# Patient Record
Sex: Female | Born: 1995 | Race: Black or African American | Hispanic: No | Marital: Single | Smoking: Never smoker
Health system: Southern US, Community
[De-identification: ages and names within clinical notes are randomized; demographics above are authoritative.]

---
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
## 2019-05-23 ENCOUNTER — Other Ambulatory Visit: Payer: Self-pay

## 2019-05-23 ENCOUNTER — Emergency Department (HOSPITAL_BASED_OUTPATIENT_CLINIC_OR_DEPARTMENT_OTHER): Payer: Medicaid Other

## 2019-05-23 ENCOUNTER — Encounter (HOSPITAL_BASED_OUTPATIENT_CLINIC_OR_DEPARTMENT_OTHER): Payer: Self-pay | Admitting: Emergency Medicine

## 2019-05-23 ENCOUNTER — Emergency Department (HOSPITAL_BASED_OUTPATIENT_CLINIC_OR_DEPARTMENT_OTHER)
Admission: EM | Admit: 2019-05-23 | Discharge: 2019-05-23 | Disposition: A | Discharge status: Home / Self Care | Payer: Medicaid Other | Attending: Emergency Medicine | Admitting: Emergency Medicine

## 2019-05-23 DIAGNOSIS — M940 Chondrocostal junction syndrome [Tietze]: Secondary | ICD-10-CM | POA: Insufficient documentation

## 2019-05-23 DIAGNOSIS — R0602 Shortness of breath: Secondary | ICD-10-CM | POA: Insufficient documentation

## 2019-05-23 MED ORDER — IBUPROFEN 600 MG PO TABS: 600.0000 mg | ORAL_TABLET | Freq: Four times a day (QID) | ORAL | 0 refills | Status: DC | PRN

## 2019-05-23 MED ORDER — IBUPROFEN 100 MG/5ML PO SUSP
600.0000 mg | Freq: Once | ORAL | Status: AC
  Administered 2019-05-23: 600 mg via ORAL
  Filled 2019-05-23: qty 30

## 2019-05-23 MED ORDER — IBUPROFEN 400 MG PO TABS
600.0000 mg | ORAL_TABLET | Freq: Once | ORAL | Status: DC
  Filled 2019-05-23: qty 1

## 2019-05-23 NOTE — ED Provider Notes (Signed)
[facility] EMERGENCY DEPARTMENT Provider Note   CSN: [id number] Arrival date & time: 05/23/19  1738     History Chief Complaint  Patient presents with  . Chest Pain    Whitney Hatfield is a 24 y.o. female presented to emergency department with sharp, pleuritic right-sided chest pain.  She reports that this began earlier this morning upon waking up.  She describes a painful sensation along her sternal rib line bilaterally, that radiates along the bottom of her ribs bilaterally, also radiates up towards her neck.  It is worse with coughing.  It is very sharp pain.  She has never felt this before.  She denies any chest pressure.  She denies any nausea vomiting or diarrhea.  She denies any fevers or chills.  She denies any sore throat, headaches, viral URI type symptoms recently.  She has not taken any medications for this at home.  No hemoptysis or asymmetric LE edema. Patient denies personal or family history of DVT or PE. No recent hormone use (including OCP); travel for >6 hours; prolonged immobilization for greater than 3 days; surgeries or trauma in the last 4 weeks; or malignancy with treatment within 6 months.  No sig family hx of MI.  She smokes marijuana occasionally, not daily, no cigarette smoking.  HPI     History reviewed. No pertinent past medical history.  There are no problems to display for this patient.   History reviewed. No pertinent surgical history.   OB History   No obstetric history on file.     No family history on file.  Social History   Tobacco Use  . Smoking status: Never Smoker  . Smokeless tobacco: Never Used  Substance Use Topics  . Alcohol use: Not Currently  . Drug use: Not on file    Home Medications Prior to Admission medications   Medication Sig Start Date End Date Taking? Authorizing Provider  ibuprofen (ADVIL) 600 MG tablet Take 1 tablet (600 mg total) by mouth every 6 (six) hours as needed for up to 30 doses. 05/23/19    Terald Sleeper, MD    Allergies    Patient has no known allergies.  Review of Systems   Review of Systems  Constitutional: Negative for chills and fever.  HENT: Negative for ear pain and sore throat.   Eyes: Negative for pain and visual disturbance.  Respiratory: Positive for shortness of breath. Negative for cough.   Cardiovascular: Positive for chest pain. Negative for palpitations.  Gastrointestinal: Negative for abdominal pain and vomiting.  Musculoskeletal: Positive for arthralgias and myalgias. Negative for back pain and neck pain.  Skin: Negative for color change and rash.  Neurological: Negative for syncope and headaches.  Psychiatric/Behavioral: Negative for agitation and confusion.  All other systems reviewed and are negative.   Physical Exam Updated Vital Signs BP 121/69 (BP Location: Right Arm)   Pulse 75   Temp 98.2 F (36.8 C)   Resp 15   Ht 5\' 4"  (1.626 m)   Wt 69.9 kg   SpO2 100%   BMI 26.43 kg/m   Physical Exam Vitals and nursing note reviewed.  Constitutional:      General: She is not in acute distress.    Appearance: She is well-developed.  HENT:     Head: Normocephalic and atraumatic.  Eyes:     Conjunctiva/sclera: Conjunctivae normal.  Cardiovascular:     Rate and Rhythm: Normal rate and regular rhythm.     Heart sounds: Normal heart sounds.  No murmur.  Pulmonary:     Effort: Pulmonary effort is normal. No respiratory distress.     Breath sounds: Normal breath sounds.  Abdominal:     Palpations: Abdomen is soft.     Tenderness: There is no abdominal tenderness.  Musculoskeletal:     Cervical back: Neck supple.     Comments: Parasternal costochondral muscle tenderness, with reproducible chest pain to palpation Positive crowing rooster sign  Skin:    General: Skin is warm and dry.  Neurological:     General: No focal deficit present.     Mental Status: She is alert and oriented to person, place, and time.  Psychiatric:        Mood  and Affect: Mood normal.        Behavior: Behavior normal.     ED Results / Procedures / Treatments   Labs (all labs ordered are listed, but only abnormal results are displayed) Labs Reviewed - No data to display  EKG EKG Interpretation  Date/Time:  Wednesday May 23 2019 17:45:24 EDT Ventricular Rate:  73 PR Interval:    QRS Duration: 86 QT Interval:  359 QTC Calculation: 396 R Axis:   46 Text Interpretation: Sinus rhythm Borderline short PR interval Borderline low voltage, extremity leads No STEMI Confirmed by Octaviano Glow [id number]) on 05/23/2019 6:30:52 PM   Radiology DG Chest 2 View  Result Date: 05/23/2019 CLINICAL DATA:  Burning RIGHT-side chest pain radiating to neck and abdomen for 1 day, increased pain with inspiration EXAM: CHEST - 2 VIEW COMPARISON:  11/24/2018 FINDINGS: Jewelry artifacts project over lower chest. Normal heart size, mediastinal contours, and pulmonary vascularity. Lungs clear. No pulmonary infiltrate, pleural effusion, or pneumothorax. Bones unremarkable. IMPRESSION: No acute abnormalities. Electronically Signed   By: Lavonia Dana M.D.   On: 05/23/2019 19:10    Procedures Procedures (including critical care time)  Medications Ordered in ED Medications  ibuprofen (ADVIL) 100 MG/5ML suspension 600 mg (600 mg Oral Given 05/23/19 1825)    ED Course  I have reviewed the triage vital signs and the nursing notes.  Pertinent labs & imaging results that were available during my care of the patient were reviewed by me and considered in my medical decision making (see chart for details).  23 year old female here with anterior sharp chest pain since this morning.  Extremely reproducible on exam.  Her tenderness is along the parasternal midline at the insertion point of her costochondral muscles, highly suggestive of costochrondritis.    ECG per my interpretation was a NSR with no ischemic findings.  Highly doubtful of ACS with this presentation.  PERC  negative, highly doubtful of PE  No evidence of PTX or PNA on her xrays, per my interpretation  Patient stable, given motrin for pain with some improvement, and discharged     Final Clinical Impression(s) / ED Diagnoses Final diagnoses:  Costochondritis    Rx / DC Orders ED Discharge Orders         Ordered    ibuprofen (ADVIL) 600 MG tablet  Every 6 hours PRN     05/23/19 1838           Wyvonnia Dusky, MD 05/24/19 1028

## 2019-05-23 NOTE — ED Triage Notes (Signed)
Pt reports "burning" right sided chest pain radiating to neck and abd x1 day. States no meds PTA, no hx.

## 2019-06-20 ENCOUNTER — Encounter (HOSPITAL_BASED_OUTPATIENT_CLINIC_OR_DEPARTMENT_OTHER): Payer: Self-pay | Admitting: Emergency Medicine

## 2019-06-20 ENCOUNTER — Emergency Department (HOSPITAL_BASED_OUTPATIENT_CLINIC_OR_DEPARTMENT_OTHER)
Admission: EM | Admit: 2019-06-20 | Discharge: 2019-06-20 | Disposition: A | Discharge status: Home / Self Care | Payer: Medicaid Other | Attending: Emergency Medicine | Admitting: Emergency Medicine

## 2019-06-20 ENCOUNTER — Other Ambulatory Visit: Payer: Self-pay

## 2019-06-20 DIAGNOSIS — J029 Acute pharyngitis, unspecified: Secondary | ICD-10-CM | POA: Insufficient documentation

## 2019-06-20 LAB — GROUP A STREP BY PCR: Group A Strep by PCR: NOT DETECTED

## 2019-06-20 MED ORDER — DEXAMETHASONE SODIUM PHOSPHATE 10 MG/ML IJ SOLN
10.0000 mg | Freq: Once | INTRAMUSCULAR | Status: AC
  Administered 2019-06-20: 10 mg via INTRAMUSCULAR
  Filled 2019-06-20: qty 1

## 2019-06-20 MED ORDER — ACETAMINOPHEN 325 MG PO TABS
650.0000 mg | ORAL_TABLET | Freq: Once | ORAL | Status: AC | PRN
  Administered 2019-06-20: 650 mg via ORAL
  Filled 2019-06-20: qty 2

## 2019-06-20 NOTE — ED Notes (Signed)
ED Provider at bedside. 

## 2019-06-20 NOTE — ED Provider Notes (Signed)
[facility] EMERGENCY DEPARTMENT Provider Note   CSN: [id number] Arrival date & time: 06/20/19  2458     History Chief Complaint  Patient presents with  . Sore Throat    Whitney Hatfield is a 24 y.o. female.  HPI Patient presents today due to sore throat.  She states it started about 2 days ago.  Worsens with swallowing and eating.  She reports associated congestion, productive cough with clear sputum, fatigue.  She has not taken anything for symptoms.  She does confirm her medical history of gonorrhea/chlamydia.  She states she was treated for both.  She denies any unprotected sex.  No oral sex.  She denies any recent sick contacts.  She denies chest pain, shortness of breath, drooling, GU complaints, syncope.    History reviewed. No pertinent past medical history.  There are no problems to display for this patient.   History reviewed. No pertinent surgical history.   OB History   No obstetric history on file.     No family history on file.  Social History   Tobacco Use  . Smoking status: Never Smoker  . Smokeless tobacco: Never Used  Substance Use Topics  . Alcohol use: Not Currently  . Drug use: Not on file    Home Medications Prior to Admission medications   Medication Sig Start Date End Date Taking? Authorizing Provider  ibuprofen (ADVIL) 600 MG tablet Take 1 tablet (600 mg total) by mouth every 6 (six) hours as needed for up to 30 doses. 05/23/19   Terald Sleeper, MD    Allergies    Patient has no known allergies.  Review of Systems   Review of Systems  All other systems reviewed and are negative. Ten systems reviewed and are negative for acute change, except as noted in the HPI.   Physical Exam Updated Vital Signs BP 119/75 (BP Location: Right Arm)   Pulse 100   Temp (!) 101.1 F (38.4 C) (Oral)   Resp 18   Ht 5\' 5"  (1.651 m)   Wt 69.9 kg   LMP 06/10/2019   SpO2 98%   BMI 25.63 kg/m   Physical Exam Vitals and nursing note  reviewed.  Constitutional:      General: She is in acute distress.     Appearance: Normal appearance. She is not ill-appearing, toxic-appearing or diaphoretic.     Comments: Patient is sitting upright.  She appears fatigued.  She phonates clearly and coherently.  She speaks in complete sentences.  HENT:     Head: Normocephalic and atraumatic.     Right Ear: External ear normal.     Left Ear: External ear normal.     Nose: Nose normal.     Mouth/Throat:     Mouth: Mucous membranes are moist.     Pharynx: Oropharynx is clear. Uvula midline. Posterior oropharyngeal erythema present. No oropharyngeal exudate.     Tonsils: No tonsillar exudate or tonsillar abscesses. 2+ on the right. 2+ on the left.     Comments: Diffuse erythema noted in the posterior oropharynx.  No exudates.  No lesions.  No hot potato voice.  Patient is readily handling secretions.  Uvula midline. Eyes:     Extraocular Movements: Extraocular movements intact.     Right eye: Normal extraocular motion.     Left eye: Normal extraocular motion.     Conjunctiva/sclera: Conjunctivae normal.     Pupils: Pupils are equal, round, and reactive to light.  Cardiovascular:  Rate and Rhythm: Normal rate and regular rhythm.     Pulses: Normal pulses.     Heart sounds: Normal heart sounds. No murmur. No friction rub. No gallop.   Pulmonary:     Effort: Pulmonary effort is normal. No respiratory distress.     Breath sounds: Normal breath sounds. No stridor. No wheezing, rhonchi or rales.  Chest:     Chest wall: No tenderness.  Abdominal:     General: Abdomen is flat.     Palpations: Abdomen is soft.     Tenderness: There is no abdominal tenderness.     Comments: Abdomen is soft.  No tenderness appreciated with deep palpation in all 4 quadrants.  Musculoskeletal:        General: Normal range of motion.     Cervical back: Normal range of motion and neck supple. No tenderness.  Lymphadenopathy:     Cervical: No cervical  adenopathy.  Skin:    General: Skin is warm and dry.  Neurological:     General: No focal deficit present.     Mental Status: She is alert and oriented to person, place, and time.  Psychiatric:        Mood and Affect: Mood normal.        Behavior: Behavior normal.    ED Results / Procedures / Treatments   Labs (all labs ordered are listed, but only abnormal results are displayed) Labs Reviewed  GROUP A STREP BY PCR   EKG None  Radiology No results found.  Procedures Procedures (including critical care time)  Medications Ordered in ED Medications  acetaminophen (TYLENOL) tablet 650 mg (650 mg Oral Given 06/20/19 0905)    ED Course  I have reviewed the triage vital signs and the nursing notes.  Pertinent labs & imaging results that were available during my care of the patient were reviewed by me and considered in my medical decision making (see chart for details).  Clinical Course as of Jun 20 950  Wed Jun 20, 2019  0913 Patient is a 24 year old female who presents with 2 days of sore throat.  No concerning signs for PTA on exam.  Patient is phonating clearly.  Patient is readily handling secretions.  Her initial temperature was 101.1 Fahrenheit.  Nursing staff gave patient 650 mg of APAP.  Rapid strep obtained.  Results in process.  Will reassess.   [LJ]  [id number] Strep test was negative.  Discussed possibility of COVID-19 testing and patient declined at this time.  She does have a history of gonorrhea/chlamydia but states this was treated.  She denies any unprotected or oral sex since she was treated for both gonorrhea/chlamydia.  Discussed her symptoms likely being viral in nature.  She denies any possibility that she is pregnant.  Will give patient IM Decadron.  She was given strict return precautions.  She understands return to the emergency department any new or worsening symptoms.  We discussed multiple OTC medications to treat her sore throat symptomatically.  Her questions  were answered and she was amicable at the time of discharge.   [LJ]    Clinical Course User Index [LJ] Rayna Sexton, PA-C   MDM Rules/Calculators/A&P                      Patient presents with history, physical exam, clinical course as noted above.  Patient was given strict return precautions.  Her symptoms seem likely viral in nature.  Rapid strep test was negative.  Her  questions were answered and she was amicable at the time of discharge.  Her vital signs are stable.  Patient discharged to home/self care.  Condition at discharge: Stable  Note: Portions of this report may have been transcribed using voice recognition software. Every effort was made to ensure accuracy; however, inadvertent computerized transcription errors may be present.    Final Clinical Impression(s) / ED Diagnoses Final diagnoses:  Viral pharyngitis    Rx / DC Orders ED Discharge Orders    None       Placido Sou, PA-C 06/20/19 3382    Geoffery Lyons, MD 06/21/19 2311

## 2019-06-20 NOTE — Discharge Instructions (Addendum)
Per our discussion, I would recommend that you treat your sore throat symptomatically.  I would recommend that you purchase some Chloraseptic max for your throat pain.  I would also recommend you continue taking both Tylenol and ibuprofen.  Tylenol and ibuprofen will help with not only the pain but your fever.  Please follow the instructions on the bottle for dosing.  If you develop any new or worsening symptoms that we discussed I need you should return to the emergency department for reevaluation.  It was a pleasure to meet you.

## 2019-06-20 NOTE — ED Triage Notes (Signed)
Sore throat x 2 days

## 2020-07-25 ENCOUNTER — Emergency Department (HOSPITAL_BASED_OUTPATIENT_CLINIC_OR_DEPARTMENT_OTHER): Payer: Medicaid Other

## 2020-07-25 ENCOUNTER — Other Ambulatory Visit (HOSPITAL_BASED_OUTPATIENT_CLINIC_OR_DEPARTMENT_OTHER): Payer: Self-pay

## 2020-07-25 ENCOUNTER — Encounter (HOSPITAL_BASED_OUTPATIENT_CLINIC_OR_DEPARTMENT_OTHER): Payer: Self-pay | Admitting: Pharmacy Technician

## 2020-07-25 ENCOUNTER — Other Ambulatory Visit: Payer: Self-pay

## 2020-07-25 ENCOUNTER — Emergency Department (HOSPITAL_BASED_OUTPATIENT_CLINIC_OR_DEPARTMENT_OTHER)
Admission: EM | Admit: 2020-07-25 | Discharge: 2020-07-25 | Disposition: A | Discharge status: Home / Self Care | Payer: Medicaid Other | Attending: Emergency Medicine | Admitting: Emergency Medicine

## 2020-07-25 DIAGNOSIS — Z3A01 Less than 8 weeks gestation of pregnancy: Secondary | ICD-10-CM | POA: Insufficient documentation

## 2020-07-25 DIAGNOSIS — R109 Unspecified abdominal pain: Secondary | ICD-10-CM

## 2020-07-25 DIAGNOSIS — O219 Vomiting of pregnancy, unspecified: Secondary | ICD-10-CM | POA: Insufficient documentation

## 2020-07-25 DIAGNOSIS — A599 Trichomoniasis, unspecified: Secondary | ICD-10-CM

## 2020-07-25 DIAGNOSIS — N898 Other specified noninflammatory disorders of vagina: Secondary | ICD-10-CM | POA: Diagnosis not present

## 2020-07-25 DIAGNOSIS — O23591 Infection of other part of genital tract in pregnancy, first trimester: Secondary | ICD-10-CM | POA: Diagnosis not present

## 2020-07-25 LAB — COMPREHENSIVE METABOLIC PANEL
ALT: 12 U/L (ref 0–44)
AST: 18 U/L (ref 15–41)
Albumin: 4.6 g/dL (ref 3.5–5.0)
Alkaline Phosphatase: 39 U/L (ref 38–126)
Anion gap: 10 (ref 5–15)
BUN: 6 mg/dL (ref 6–20)
CO2: 23 mmol/L (ref 22–32)
Calcium: 9.6 mg/dL (ref 8.9–10.3)
Chloride: 103 mmol/L (ref 98–111)
Creatinine, Ser: 0.65 mg/dL (ref 0.44–1.00)
GFR, Estimated: >60 mL/min (ref >60)
Glucose, Bld: 89 mg/dL (ref 70–99)
Potassium: 4 mmol/L (ref 3.5–5.1)
Sodium: 136 mmol/L (ref 135–145)
Total Bilirubin: 0.5 mg/dL (ref 0.3–1.2)
Total Protein: 8.1 g/dL (ref 6.5–8.1)

## 2020-07-25 LAB — URINALYSIS, MICROSCOPIC (REFLEX): RBC / HPF: NONE SEEN RBC/hpf (ref 0–5)

## 2020-07-25 LAB — CBC
HCT: 41.2 % (ref 36.0–46.0)
Hemoglobin: 13.9 g/dL (ref 12.0–15.0)
MCH: 30.2 pg (ref 26.0–34.0)
MCHC: 33.7 g/dL (ref 30.0–36.0)
MCV: 89.4 fL (ref 80.0–100.0)
Platelets: 253 10*3/uL (ref 150–400)
RBC: 4.61 MIL/uL (ref 3.87–5.11)
RDW: 11.5 % (ref 11.5–15.5)
WBC: 6.3 10*3/uL (ref 4.0–10.5)
nRBC: 0 % (ref 0.0–0.2)

## 2020-07-25 LAB — LIPASE, BLOOD: Lipase: 29 U/L (ref 11–51)

## 2020-07-25 LAB — URINALYSIS, ROUTINE W REFLEX MICROSCOPIC
Bilirubin Urine: NEGATIVE
Glucose, UA: NEGATIVE mg/dL
Hgb urine dipstick: NEGATIVE
Ketones, ur: >80 mg/dL — AB
Nitrite: NEGATIVE
Protein, ur: NEGATIVE mg/dL
Specific Gravity, Urine: 1.025 (ref 1.005–1.030)
pH: 6.5 (ref 5.0–8.0)

## 2020-07-25 LAB — WET PREP, GENITAL
Sperm: NONE SEEN
Yeast Wet Prep HPF POC: NONE SEEN

## 2020-07-25 LAB — HIV ANTIBODY (ROUTINE TESTING W REFLEX): HIV Screen 4th Generation wRfx: NONREACTIVE

## 2020-07-25 LAB — HCG, QUANTITATIVE, PREGNANCY: hCG, Beta Chain, Quant, S: 77838 m[IU]/mL — ABNORMAL HIGH (ref <5)

## 2020-07-25 LAB — PREGNANCY, URINE: Preg Test, Ur: POSITIVE — AB

## 2020-07-25 MED ORDER — METRONIDAZOLE 500 MG PO TABS
500.0000 mg | ORAL_TABLET | Freq: Two times a day (BID) | ORAL | 0 refills | Status: DC
  Filled 2020-07-25: qty 14, 7d supply, fill #0

## 2020-07-25 MED ORDER — CEFTRIAXONE SODIUM 500 MG IJ SOLR
500.0000 mg | Freq: Once | INTRAMUSCULAR | Status: AC
  Administered 2020-07-25: 500 mg via INTRAMUSCULAR
  Filled 2020-07-25: qty 500

## 2020-07-25 MED ORDER — SODIUM CHLORIDE 0.9 % IV BOLUS
1000.0000 mL | Freq: Once | INTRAVENOUS | Status: AC
  Administered 2020-07-25: 1000 mL via INTRAVENOUS

## 2020-07-25 MED ORDER — ONDANSETRON 4 MG PO TBDP
4.0000 mg | ORAL_TABLET | Freq: Three times a day (TID) | ORAL | 0 refills | Status: DC | PRN
  Filled 2020-07-25: qty 20, 7d supply, fill #0

## 2020-07-25 MED ORDER — ONDANSETRON HCL 4 MG/2ML IJ SOLN
4.0000 mg | Freq: Once | INTRAMUSCULAR | Status: AC
  Administered 2020-07-25: 4 mg via INTRAVENOUS
  Filled 2020-07-25: qty 2

## 2020-07-25 MED ORDER — LIDOCAINE HCL (PF) 1 % IJ SOLN
1.0000 mL | Freq: Once | INTRAMUSCULAR | Status: AC
  Administered 2020-07-25: 1 mL
  Filled 2020-07-25: qty 5

## 2020-07-25 MED ORDER — AZITHROMYCIN 250 MG PO TABS
1000.0000 mg | ORAL_TABLET | Freq: Once | ORAL | Status: AC
  Administered 2020-07-25: 1000 mg via ORAL
  Filled 2020-07-25: qty 4

## 2020-07-25 NOTE — ED Notes (Signed)
Patient transported to Ultrasound 

## 2020-07-25 NOTE — ED Notes (Signed)
NS bolus not given, IV infiltrated.  Provider made aware

## 2020-07-25 NOTE — ED Triage Notes (Signed)
Pt here with reports of abdominal pain, nausea and vomiting. Reports taking 2 pregnancy tests, one negative and one positive.

## 2020-07-25 NOTE — ED Notes (Signed)
Pelvic cart set up in room 

## 2020-07-25 NOTE — ED Provider Notes (Signed)
[facility] EMERGENCY DEPARTMENT Provider Note   CSN: [id number] Arrival date & time: 07/25/20  1052     History Chief Complaint  Patient presents with   Abdominal Pain   Emesis    Whitney Hatfield is a 25 y.o. female.   Abdominal Pain Associated symptoms: nausea and vomiting   Associated symptoms: no chest pain, no chills, no cough, no diarrhea, no dysuria, no fever, no hematuria, no shortness of breath, no vaginal bleeding and no vaginal discharge   Emesis Associated symptoms: abdominal pain   Associated symptoms: no chills, no cough, no diarrhea and no fever       Whitney Hatfield is a 25 y.o. female, with a history of G3P2, presenting to the ED with abdominal pain beginning last night.  Accompanied by nausea and vomiting. She also states she thinks she is pregnant and had one positive and one negative test at home.  Her abdominal pain is cramping, worse in the suprapubic and left lower quadrant regions, mild to moderate, nonradiating. LMP around June 5.  Follows with Dr. Shawnie Pons for OB/GYN. Denies fever/chills, chest pain, shortness of breath, urinary symptoms, abnormal vaginal discharge, vaginal bleeding, diarrhea, or any other complaints.   History reviewed. No pertinent past medical history.  There are no problems to display for this patient.   History reviewed. No pertinent surgical history.   OB History   No obstetric history on file.     No family history on file.  Social History   Tobacco Use   Smoking status: Never   Smokeless tobacco: Never  Substance Use Topics   Alcohol use: Not Currently    Home Medications Prior to Admission medications   Medication Sig Start Date End Date Taking? Authorizing Provider  metroNIDAZOLE (FLAGYL) 500 MG tablet Take 1 tablet (500 mg total) by mouth 2 (two) times daily. 07/25/20  Yes Amarise Lillo C, PA-C  ondansetron (ZOFRAN ODT) 4 MG disintegrating tablet Take 1 tablet (4 mg total) by mouth every 8 (eight)  hours as needed for nausea or vomiting. 07/25/20  Yes Oluwatobi Visser C, PA-C  ibuprofen (ADVIL) 600 MG tablet Take 1 tablet (600 mg total) by mouth every 6 (six) hours as needed for up to 30 doses. 05/23/19   Terald Sleeper, MD    Allergies    Patient has no known allergies.  Review of Systems   Review of Systems  Constitutional:  Negative for chills, diaphoresis and fever.  Respiratory:  Negative for cough and shortness of breath.   Cardiovascular:  Negative for chest pain.  Gastrointestinal:  Positive for abdominal pain, nausea and vomiting. Negative for diarrhea.  Genitourinary:  Negative for dysuria, flank pain, hematuria, vaginal bleeding and vaginal discharge.  Musculoskeletal:  Negative for back pain.  Neurological:  Negative for syncope and weakness.  All other systems reviewed and are negative.  Physical Exam Updated Vital Signs BP 119/61 (BP Location: Right Arm)   Pulse 68   Temp 97.8 F (36.6 C) (Oral)   Resp 20   Ht 5\' 5"  (1.651 m)   Wt 69.9 kg   LMP 06/15/2020 (Approximate)   SpO2 100%   BMI 25.63 kg/m   Physical Exam Vitals and nursing note reviewed.  Constitutional:      General: She is not in acute distress.    Appearance: She is well-developed. She is not diaphoretic.  HENT:     Head: Normocephalic and atraumatic.     Mouth/Throat:     Mouth: Mucous membranes are  moist.     Pharynx: Oropharynx is clear.  Eyes:     Conjunctiva/sclera: Conjunctivae normal.  Cardiovascular:     Rate and Rhythm: Normal rate and regular rhythm.     Pulses: Normal pulses.          Radial pulses are 2+ on the right side and 2+ on the left side.     Heart sounds: Normal heart sounds.  Pulmonary:     Effort: Pulmonary effort is normal. No respiratory distress.     Breath sounds: Normal breath sounds.  Abdominal:     Palpations: Abdomen is soft.     Tenderness: There is abdominal tenderness in the suprapubic area and left lower quadrant. There is no guarding.   Genitourinary:    Comments: External genitalia normal Vagina with discharge - scant, white discharge Cervix  normal, though cervical os not able to be visualized. negative for cervical motion tenderness Adnexa palpated, no masses, negative for tenderness noted Bladder palpated negative for tenderness Uterus palpated no masses, negative for tenderness  No inguinal lymphadenopathy. Otherwise normal female genitalia. RN served as Biomedical engineer during exam. Musculoskeletal:     Cervical back: Neck supple.     Right lower leg: No edema.     Left lower leg: No edema.  Skin:    General: Skin is warm and dry.  Neurological:     Mental Status: She is alert.  Psychiatric:        Mood and Affect: Mood and affect normal.        Speech: Speech normal.        Behavior: Behavior normal.    ED Results / Procedures / Treatments   Labs (all labs ordered are listed, but only abnormal results are displayed) Labs Reviewed  WET PREP, GENITAL - Abnormal; Notable for the following components:      Result Value   Trich, Wet Prep PRESENT (*)    Clue Cells Wet Prep HPF POC PRESENT (*)    WBC, Wet Prep HPF POC MODERATE (*)    All other components within normal limits  URINALYSIS, ROUTINE W REFLEX MICROSCOPIC - Abnormal; Notable for the following components:   APPearance HAZY (*)    Ketones, ur >80 (*)    Leukocytes,Ua TRACE (*)    All other components within normal limits  PREGNANCY, URINE - Abnormal; Notable for the following components:   Preg Test, Ur POSITIVE (*)    All other components within normal limits  URINALYSIS, MICROSCOPIC (REFLEX) - Abnormal; Notable for the following components:   Bacteria, UA MANY (*)    Trichomonas, UA PRESENT (*)    All other components within normal limits  HCG, QUANTITATIVE, PREGNANCY - Abnormal; Notable for the following components:   hCG, Beta Chain, [place] Longest 50,093 (*)    All other components within normal limits  URINE CULTURE  LIPASE, BLOOD   COMPREHENSIVE METABOLIC PANEL  CBC  RPR  HIV ANTIBODY (ROUTINE TESTING W REFLEX)  GC/CHLAMYDIA PROBE AMP ([place]) NOT AT [facility]    EKG None  Radiology US OB Comp Less 14 Wks  Result Date: 07/25/2020 CLINICAL DATA:  Pregnancy.  Abdominal pain EXAM: OBSTETRIC <14 WK ULTRASOUND TECHNIQUE: Transabdominal ultrasound was performed for evaluation of the gestation as well as the maternal uterus and adnexal regions. Patient declined transvaginal exam. COMPARISON:  None. FINDINGS: Intrauterine gestational sac: Single Yolk sac:  Visualized. Embryo:  Visualized. Cardiac Activity: Visualized. Heart Rate: 208 bpm CRL:   5.2 mm   6 w 2  d                  [place] EDC: 03/17/2021 Subchorionic hemorrhage:  None visualized. Maternal uterus/adnexae: Uterus and right ovary within normal limits. Left ovary was not visualized. No free fluid within the pelvis. IMPRESSION: 1. Single live intrauterine gestation measuring 6 weeks 2 days by crown-rump length. 2. Abnormally elevated fetal heart rate of 208 beats per minute. Electronically Signed   By: Duanne Guess D.O.   On: 07/25/2020 16:27    Procedures Procedures   Medications Ordered in ED Medications  ondansetron (ZOFRAN) injection 4 mg (4 mg Intravenous Given 07/25/20 1315)  sodium chloride 0.9 % bolus 1,000 mL (0 mLs Intravenous Stopped 07/25/20 1437)  azithromycin (ZITHROMAX) tablet 1,000 mg (1,000 mg Oral Given 07/25/20 1436)  cefTRIAXone (ROCEPHIN) injection 500 mg (500 mg Intramuscular Given 07/25/20 1437)  lidocaine (PF) (XYLOCAINE) 1 % injection 1 mL (1 mL Other Given 07/25/20 1437)  sodium chloride 0.9 % bolus 1,000 mL (0 mLs Intravenous Stopped 07/25/20 1647)    ED Course  I have reviewed the triage vital signs and the nursing notes.  Pertinent labs & imaging results that were available during my care of the patient were reviewed by me and considered in my medical decision making (see chart for details).  Clinical Course as of 07/25/20 1649  Fri  Jul 25, 2020  1218 Bacteria, UA(!): MANY [SJ]  1219 Trichomonas, UA(!): PRESENT [SJ]  1608 Spoke with Stacy from Radiology reading room.  States order was changed by the sonographer to a maternal-fetal medicine ultrasound order.  This means it was not sent to our radiologist.  She will work on getting the order adjusted. [SJ]    Clinical Course User Index [SJ] Jatniel Verastegui, Hillard Danker, PA-C   MDM Rules/Calculators/A&P                          Patient presents with abdominal cramping, nausea, vomiting. Patient is nontoxic appearing, afebrile, not tachycardic, not tachypneic, not hypotensive, maintains excellent SPO2 on room air, and is in no apparent distress.   I have reviewed the patient's chart to obtain more information.   I reviewed and interpreted the patient's labs and imaging studies. Bacteriuria noted without urinary symptoms.  Per current recommendations, urine culture sent and will be addressed, as necessary based on urine culture results.  Positive pregnancy test with adequate quantitative hCG. Confirmed IUP, but with abnormally high fetal heart rate.  This was communicated with the patient. Urine and wet prep positive for trichomonas.  We will initiate treatment for this. Patient is in a fairly new relationship.  She was also treated for other possible STDs. Due to the patient's pregnancy we opted for alternative treatment other than doxycycline. She has an OB/GYN with whom she can follow-up. She had no vomiting throughout her entire ED course and was tolerating PO prior to discharge.  She also had resolution in her abdominal discomfort as well.  The patient was given instructions for home care as well as return precautions. Patient voices understanding of these instructions, accepts the plan, and is comfortable with discharge.    Final Clinical Impression(s) / ED Diagnoses Final diagnoses:  Nausea and vomiting in pregnancy  Trichomonas infection    Rx / DC Orders ED Discharge  Orders          Ordered    metroNIDAZOLE (FLAGYL) 500 MG tablet  2 times daily  07/25/20 1646    ondansetron (ZOFRAN ODT) 4 MG disintegrating tablet  Every 8 hours PRN        07/25/20 1646             Anselm PancoastJoy, Isaiha Asare C, PA-C 07/25/20 1659    Gwyneth SproutPlunkett, Whitney, MD 07/27/20 2217

## 2020-07-25 NOTE — ED Notes (Signed)
Patient Alert and oriented to baseline. Stable and ambulatory to baseline. Patient verbalized understanding of the discharge instructions.  Patient belongings were taken by the patient.   

## 2020-07-25 NOTE — Discharge Instructions (Addendum)
Confirmation of pregnancy on ultrasound.  Follow-up with OB/GYN on this matter.   Hand washing: Wash your hands throughout the day, but especially before and after touching the face, using the restroom, sneezing, coughing, or touching surfaces that have been coughed or sneezed upon. Hydration: Symptoms will be intensified and complicated by dehydration. Dehydration can also extend the duration of symptoms. Drink plenty of fluids and get plenty of rest. You should be drinking at least half a liter of water an hour to stay hydrated. Electrolyte drinks (ex. Gatorade, Powerade, Pedialyte) are also encouraged. You should be drinking enough fluids to make your urine light yellow, almost clear. If this is not the case, you are not drinking enough water. Please note that some of the treatments indicated below will not be effective if you are not adequately hydrated. Diet: Please concentrate on hydration, however, you may introduce food slowly.  Start with a clear liquid diet, progressed to a full liquid diet, and then bland solids as you are able. Pain or fever:  Tylenol as needed for pain Nausea/vomiting: Use the ondansetron (generic for Zofran) for nausea or vomiting.  This medication may not prevent all vomiting or nausea, but can help facilitate better hydration. Things that can help with nausea/vomiting also include peppermint/menthol candies, vitamin B12, and ginger. Follow-up: Follow-up with a primary care provider on this matter. Return: Return should you develop a fever, bloody diarrhea, increased abdominal pain, uncontrolled vomiting, or any other major concerns.  For prescription assistance, may try using prescription discount sites or apps, such as goodrx.com

## 2020-07-26 LAB — URINE CULTURE: Culture: <10000 — AB

## 2020-07-26 LAB — RPR: RPR Ser Ql: NONREACTIVE

## 2020-07-28 LAB — GC/CHLAMYDIA PROBE AMP (~~LOC~~) NOT AT ARMC
Chlamydia: NEGATIVE
Comment: NEGATIVE
Comment: NORMAL
Neisseria Gonorrhea: NEGATIVE

## 2020-08-19 ENCOUNTER — Other Ambulatory Visit: Payer: Self-pay

## 2020-08-19 ENCOUNTER — Emergency Department (HOSPITAL_BASED_OUTPATIENT_CLINIC_OR_DEPARTMENT_OTHER)
Admission: EM | Admit: 2020-08-19 | Discharge: 2020-08-19 | Disposition: A | Discharge status: Home / Self Care | Payer: Medicaid Other | Attending: Emergency Medicine | Admitting: Emergency Medicine

## 2020-08-19 ENCOUNTER — Encounter (HOSPITAL_BASED_OUTPATIENT_CLINIC_OR_DEPARTMENT_OTHER): Payer: Self-pay | Admitting: *Deleted

## 2020-08-19 DIAGNOSIS — A599 Trichomoniasis, unspecified: Secondary | ICD-10-CM | POA: Diagnosis not present

## 2020-08-19 DIAGNOSIS — Z3A11 11 weeks gestation of pregnancy: Secondary | ICD-10-CM | POA: Diagnosis not present

## 2020-08-19 DIAGNOSIS — O219 Vomiting of pregnancy, unspecified: Secondary | ICD-10-CM | POA: Insufficient documentation

## 2020-08-19 LAB — CBC WITH DIFFERENTIAL/PLATELET
Abs Immature Granulocytes: 0.01 10*3/uL (ref 0.00–0.07)
Basophils Absolute: 0 10*3/uL (ref 0.0–0.1)
Basophils Relative: 0 %
Eosinophils Absolute: 0 10*3/uL (ref 0.0–0.5)
Eosinophils Relative: 0 %
HCT: 33.5 % — ABNORMAL LOW (ref 36.0–46.0)
Hemoglobin: 11.6 g/dL — ABNORMAL LOW (ref 12.0–15.0)
Immature Granulocytes: 0 %
Lymphocytes Relative: 27 %
Lymphs Abs: 2 10*3/uL (ref 0.7–4.0)
MCH: 30.8 pg (ref 26.0–34.0)
MCHC: 34.6 g/dL (ref 30.0–36.0)
MCV: 88.9 fL (ref 80.0–100.0)
Monocytes Absolute: 0.4 10*3/uL (ref 0.1–1.0)
Monocytes Relative: 5 %
Neutro Abs: 4.8 10*3/uL (ref 1.7–7.7)
Neutrophils Relative %: 68 %
Platelets: 209 10*3/uL (ref 150–400)
RBC: 3.77 MIL/uL — ABNORMAL LOW (ref 3.87–5.11)
RDW: 11.9 % (ref 11.5–15.5)
WBC: 7.2 10*3/uL (ref 4.0–10.5)
nRBC: 0 % (ref 0.0–0.2)

## 2020-08-19 LAB — COMPREHENSIVE METABOLIC PANEL
ALT: 11 U/L (ref 0–44)
AST: 17 U/L (ref 15–41)
Albumin: 3.9 g/dL (ref 3.5–5.0)
Alkaline Phosphatase: 33 U/L — ABNORMAL LOW (ref 38–126)
Anion gap: 8 (ref 5–15)
BUN: <5 mg/dL — ABNORMAL LOW (ref 6–20)
CO2: 23 mmol/L (ref 22–32)
Calcium: 8.7 mg/dL — ABNORMAL LOW (ref 8.9–10.3)
Chloride: 104 mmol/L (ref 98–111)
Creatinine, Ser: 0.54 mg/dL (ref 0.44–1.00)
GFR, Estimated: >60 mL/min (ref >60)
Glucose, Bld: 85 mg/dL (ref 70–99)
Potassium: 3.5 mmol/L (ref 3.5–5.1)
Sodium: 135 mmol/L (ref 135–145)
Total Bilirubin: 0.5 mg/dL (ref 0.3–1.2)
Total Protein: 6.8 g/dL (ref 6.5–8.1)

## 2020-08-19 LAB — URINALYSIS, ROUTINE W REFLEX MICROSCOPIC
Bilirubin Urine: NEGATIVE
Glucose, UA: NEGATIVE mg/dL
Hgb urine dipstick: NEGATIVE
Ketones, ur: 15 mg/dL — AB
Nitrite: NEGATIVE
Protein, ur: NEGATIVE mg/dL
Specific Gravity, Urine: 1.01 (ref 1.005–1.030)
pH: 7.5 (ref 5.0–8.0)

## 2020-08-19 LAB — URINALYSIS, MICROSCOPIC (REFLEX)

## 2020-08-19 LAB — LIPASE, BLOOD: Lipase: 23 U/L (ref 11–51)

## 2020-08-19 MED ORDER — VITAMIN B-6 25 MG PO TABS: 25.0000 mg | ORAL_TABLET | Freq: Every day | ORAL | 0 refills | Status: AC

## 2020-08-19 MED ORDER — DOXYLAMINE SUCCINATE (SLEEP) 25 MG PO TABS: 25.0000 mg | ORAL_TABLET | Freq: Every day | ORAL | 0 refills | Status: DC

## 2020-08-19 MED ORDER — METRONIDAZOLE 500 MG PO TABS: 500.0000 mg | ORAL_TABLET | Freq: Two times a day (BID) | ORAL | 0 refills | Status: DC

## 2020-08-19 MED ORDER — ONDANSETRON HCL 4 MG/2ML IJ SOLN
4.0000 mg | Freq: Once | INTRAMUSCULAR | Status: AC
  Administered 2020-08-19: 4 mg via INTRAVENOUS
  Filled 2020-08-19: qty 2

## 2020-08-19 MED ORDER — SODIUM CHLORIDE 0.9 % IV BOLUS
1000.0000 mL | Freq: Once | INTRAVENOUS | Status: AC
  Administered 2020-08-19: 1000 mL via INTRAVENOUS

## 2020-08-19 NOTE — ED Notes (Signed)
Tolerating PO fluids w/o issue 

## 2020-08-19 NOTE — ED Provider Notes (Signed)
[facility] EMERGENCY DEPARTMENT Provider Note   CSN: [id number] Arrival date & time: 08/19/20  1847     History Chief Complaint  Patient presents with   Emesis During Pregnancy    Whitney Hatfield is a 25 y.o. female.  Whitney Hatfield is a 25 yo G3P2 female, currently at [redacted] weeks gestation, who presents today with intractable vomiting since 7am. She states she woke up from sleep feeling nauseated and had an episode of vomiting, and has had 12 more episodes throughout the day. She has been unable to keep food or liquids down. She also notes lower abdominal pain that feels like "period cramps." She denies diarrhea, pain with BM, dysuria, or vaginal bleeding. She denies fever, contact with sick persons, and no recent diet changes. She states she had no similar episodes with previous pregnancies.       History reviewed. No pertinent past medical history.  There are no problems to display for this patient.   History reviewed. No pertinent surgical history.   OB History     Gravida  1   Para      Term      Preterm      AB      Living         SAB      IAB      Ectopic      Multiple      Live Births              No family history on file.  Social History   Tobacco Use   Smoking status: Never   Smokeless tobacco: Never  Vaping Use   Vaping Use: Never used  Substance Use Topics   Alcohol use: Not Currently    Home Medications Prior to Admission medications   Medication Sig Start Date End Date Taking? Authorizing Provider  doxylamine, Sleep, (UNISOM) 25 MG tablet Take 1 tablet (25 mg total) by mouth at bedtime. 08/19/20  Yes Jeannie Fend, PA-C  metroNIDAZOLE (FLAGYL) 500 MG tablet Take 1 tablet (500 mg total) by mouth 2 (two) times daily. 08/19/20  Yes Jeannie Fend, PA-C  vitamin B-6 (PYRIDOXINE) 25 MG tablet Take 1 tablet (25 mg total) by mouth daily. 08/19/20 09/18/20 Yes Jeannie Fend, PA-C  ibuprofen (ADVIL) 600 MG tablet Take 1 tablet  (600 mg total) by mouth every 6 (six) hours as needed for up to 30 doses. 05/23/19   Terald Sleeper, MD  ondansetron (ZOFRAN ODT) 4 MG disintegrating tablet Take 1 tablet (4 mg total) by mouth every 8 (eight) hours as needed for nausea or vomiting. 07/25/20   Joy, Shawn C, PA-C    Allergies    Patient has no known allergies.  Review of Systems   Review of Systems  Constitutional:  Negative for chills, diaphoresis and fever.  Respiratory:  Negative for shortness of breath.   Cardiovascular:  Negative for chest pain.  Gastrointestinal:  Positive for abdominal pain, nausea and vomiting. Negative for constipation and diarrhea.  Genitourinary:  Negative for dysuria, vaginal bleeding and vaginal discharge.  Musculoskeletal:  Negative for arthralgias, back pain and myalgias.  Skin:  Negative for rash and wound.  Allergic/Immunologic: Positive for immunocompromised state.  Neurological:  Negative for weakness.  Hematological:  Negative for adenopathy.  Psychiatric/Behavioral:  Negative for confusion.   All other systems reviewed and are negative.  Physical Exam Updated Vital Signs BP (!) 90/49 (BP Location: Left Arm)   Pulse 60  Temp 99.2 F (37.3 C) (Oral)   Resp 15   Ht 5\' 5"  (1.651 m)   Wt 69.9 kg   LMP 06/15/2020 (Approximate)   SpO2 100%   BMI 25.64 kg/m   Physical Exam Vitals and nursing note reviewed.  Constitutional:      General: She is not in acute distress.    Appearance: She is well-developed. She is not diaphoretic.  HENT:     Head: Normocephalic and atraumatic.  Eyes:     Conjunctiva/sclera: Conjunctivae normal.  Cardiovascular:     Rate and Rhythm: Normal rate and regular rhythm.     Pulses: Normal pulses.     Heart sounds: Normal heart sounds.  Pulmonary:     Effort: Pulmonary effort is normal.     Breath sounds: Normal breath sounds.  Abdominal:     Palpations: Abdomen is soft.     Tenderness: There is abdominal tenderness in the left lower quadrant.   Musculoskeletal:     Right lower leg: No edema.     Left lower leg: No edema.  Skin:    General: Skin is warm and dry.     Findings: No erythema or rash.  Neurological:     Mental Status: She is alert and oriented to person, place, and time.  Psychiatric:        Behavior: Behavior normal.    ED Results / Procedures / Treatments   Labs (all labs ordered are listed, but only abnormal results are displayed) Labs Reviewed  COMPREHENSIVE METABOLIC PANEL - Abnormal; Notable for the following components:      Result Value   BUN <5 (*)    Calcium 8.7 (*)    Alkaline Phosphatase 33 (*)    All other components within normal limits  CBC WITH DIFFERENTIAL/PLATELET - Abnormal; Notable for the following components:   RBC 3.77 (*)    Hemoglobin 11.6 (*)    HCT 33.5 (*)    All other components within normal limits  URINALYSIS, ROUTINE W REFLEX MICROSCOPIC - Abnormal; Notable for the following components:   Ketones, ur 15 (*)    Leukocytes,Ua SMALL (*)    All other components within normal limits  URINALYSIS, MICROSCOPIC (REFLEX) - Abnormal; Notable for the following components:   Bacteria, UA RARE (*)    Trichomonas, UA PRESENT (*)    All other components within normal limits  LIPASE, BLOOD    EKG None  Radiology No results found.  Procedures Procedures   Medications Ordered in ED Medications  sodium chloride 0.9 % bolus 1,000 mL (0 mLs Intravenous Stopped 08/19/20 2214)  ondansetron (ZOFRAN) injection 4 mg (4 mg Intravenous Given 08/19/20 2122)    ED Course  I have reviewed the triage vital signs and the nursing notes.  Pertinent labs & imaging results that were available during my care of the patient were reviewed by me and considered in my medical decision making (see chart for details).  Clinical Course as of 08/19/20 2317  Tue Aug 19, 2020  [id number] 25 year old female with complaint of nausea and vomiting in early pregnancy.  Pregnancy verified at her July ER visit, viable  IUP.  Patient was also found a positive for trichomoniasis at that visit was treated with Flagyl.  She reports compliance with medication and has not had intercourse since that time.  Did inform her partner, unsure if he has been treated.  We will treat patient again although advised she should abstain from intercourse until she has a negative test  with her OB and her partner has completed treatment.  Urinalysis with small ketones.  Lipase and normal meds, CBC and CMP without significant findings.  Patient is tolerating oral fluids, will discharge with Unisom and B6. [LM]    Clinical Course User Index [LM] Alden Hipp   MDM Rules/Calculators/A&P                           Final Clinical Impression(s) / ED Diagnoses Final diagnoses:  Nausea and vomiting in pregnancy  Trichimoniasis    Rx / DC Orders ED Discharge Orders          Ordered    doxylamine, Sleep, (UNISOM) 25 MG tablet  Daily at bedtime        08/19/20 2312    vitamin B-6 (PYRIDOXINE) 25 MG tablet  Daily        08/19/20 2312    metroNIDAZOLE (FLAGYL) 500 MG tablet  2 times daily        08/19/20 2315             Jeannie Fend, PA-C 08/19/20 2317    Jacalyn Lefevre, MD 08/20/20 2133

## 2020-08-19 NOTE — ED Triage Notes (Addendum)
Vomiting since this am. She is [redacted] weeks pregnant. Abdominal cramps. No bleeding. She has had [place] to confirm uterine placement.

## 2020-08-19 NOTE — ED Notes (Signed)
Pt. Provided ginger ale.

## 2020-08-19 NOTE — Discharge Instructions (Addendum)
No sex until you test negative and your partner has been treated. Follow up with your OB for this. Take unisom and vitamin b as prescribed for nausea and vomiting.

## 2020-08-19 NOTE — ED Notes (Signed)
Pt requesting some ice chips. Pt given the same with a spoon.

## 2020-12-18 ENCOUNTER — Other Ambulatory Visit: Payer: Self-pay

## 2020-12-18 ENCOUNTER — Encounter (HOSPITAL_BASED_OUTPATIENT_CLINIC_OR_DEPARTMENT_OTHER): Payer: Self-pay

## 2020-12-18 ENCOUNTER — Emergency Department (HOSPITAL_BASED_OUTPATIENT_CLINIC_OR_DEPARTMENT_OTHER)
Admission: EM | Admit: 2020-12-18 | Discharge: 2020-12-18 | Disposition: A | Discharge status: Other Acute Inpatient Hospital | Payer: Medicaid Other | Attending: Student | Admitting: Student

## 2020-12-18 DIAGNOSIS — R1032 Left lower quadrant pain: Secondary | ICD-10-CM | POA: Diagnosis not present

## 2020-12-18 DIAGNOSIS — R112 Nausea with vomiting, unspecified: Secondary | ICD-10-CM | POA: Diagnosis not present

## 2020-12-18 DIAGNOSIS — R1031 Right lower quadrant pain: Secondary | ICD-10-CM | POA: Diagnosis present

## 2020-12-18 DIAGNOSIS — Z20822 Contact with and (suspected) exposure to covid-19: Secondary | ICD-10-CM | POA: Diagnosis not present

## 2020-12-18 DIAGNOSIS — O26892 Other specified pregnancy related conditions, second trimester: Secondary | ICD-10-CM

## 2020-12-18 LAB — RESP PANEL BY RT-PCR (FLU A&B, COVID) ARPGX2
Influenza A by PCR: NEGATIVE
Influenza B by PCR: NEGATIVE
SARS Coronavirus 2 by RT PCR: POSITIVE — AB

## 2020-12-18 LAB — CBC WITH DIFFERENTIAL/PLATELET
Abs Immature Granulocytes: 0.03 10*3/uL (ref 0.00–0.07)
Basophils Absolute: 0 10*3/uL (ref 0.0–0.1)
Basophils Relative: 0 %
Eosinophils Absolute: 0 10*3/uL (ref 0.0–0.5)
Eosinophils Relative: 0 %
HCT: 31.5 % — ABNORMAL LOW (ref 36.0–46.0)
Hemoglobin: 10.7 g/dL — ABNORMAL LOW (ref 12.0–15.0)
Immature Granulocytes: 1 %
Lymphocytes Relative: 6 %
Lymphs Abs: 0.3 10*3/uL — ABNORMAL LOW (ref 0.7–4.0)
MCH: 31.9 pg (ref 26.0–34.0)
MCHC: 34 g/dL (ref 30.0–36.0)
MCV: 94 fL (ref 80.0–100.0)
Monocytes Absolute: 0.4 10*3/uL (ref 0.1–1.0)
Monocytes Relative: 7 %
Neutro Abs: 5.3 10*3/uL (ref 1.7–7.7)
Neutrophils Relative %: 86 %
Platelets: 182 10*3/uL (ref 150–400)
RBC: 3.35 MIL/uL — ABNORMAL LOW (ref 3.87–5.11)
RDW: 12.5 % (ref 11.5–15.5)
WBC: 6.2 10*3/uL (ref 4.0–10.5)
nRBC: 0 % (ref 0.0–0.2)

## 2020-12-18 LAB — COMPREHENSIVE METABOLIC PANEL
ALT: 26 U/L (ref 0–44)
AST: 41 U/L (ref 15–41)
Albumin: 2.9 g/dL — ABNORMAL LOW (ref 3.5–5.0)
Alkaline Phosphatase: 51 U/L (ref 38–126)
Anion gap: 8 (ref 5–15)
BUN: <5 mg/dL — ABNORMAL LOW (ref 6–20)
CO2: 21 mmol/L — ABNORMAL LOW (ref 22–32)
Calcium: 8.1 mg/dL — ABNORMAL LOW (ref 8.9–10.3)
Chloride: 107 mmol/L (ref 98–111)
Creatinine, Ser: 0.58 mg/dL (ref 0.44–1.00)
GFR, Estimated: >60 mL/min (ref >60)
Glucose, Bld: 76 mg/dL (ref 70–99)
Potassium: 3.4 mmol/L — ABNORMAL LOW (ref 3.5–5.1)
Sodium: 136 mmol/L (ref 135–145)
Total Bilirubin: 0.3 mg/dL (ref 0.3–1.2)
Total Protein: 6.4 g/dL — ABNORMAL LOW (ref 6.5–8.1)

## 2020-12-18 LAB — TROPONIN I (HIGH SENSITIVITY): Troponin I (High Sensitivity): <2 ng/L (ref <18)

## 2020-12-18 MED ORDER — LACTATED RINGERS IV BOLUS
1000.0000 mL | Freq: Once | INTRAVENOUS | Status: AC
  Administered 2020-12-18: 1000 mL via INTRAVENOUS

## 2020-12-18 MED ORDER — ONDANSETRON 4 MG PO TBDP: 4.0000 mg | ORAL_TABLET | Freq: Once | ORAL | Status: DC

## 2020-12-18 MED ORDER — DEXAMETHASONE SODIUM PHOSPHATE 10 MG/ML IJ SOLN
6.0000 mg | Freq: Once | INTRAMUSCULAR | Status: AC
  Administered 2020-12-18: 6 mg via INTRAMUSCULAR
  Filled 2020-12-18: qty 1

## 2020-12-18 MED ORDER — ALUM & MAG HYDROXIDE-SIMETH 200-200-20 MG/5ML PO SUSP
30.0000 mL | Freq: Once | ORAL | Status: AC
  Administered 2020-12-18: 30 mL via ORAL
  Filled 2020-12-18: qty 30

## 2020-12-18 MED ORDER — BETAMETHASONE SOD PHOS & ACET 6 (3-3) MG/ML IJ SUSP: 12.0000 mg | Freq: Once | INTRAMUSCULAR | Status: DC

## 2020-12-18 MED ORDER — ONDANSETRON HCL 4 MG/2ML IJ SOLN
4.0000 mg | Freq: Once | INTRAMUSCULAR | Status: AC
  Administered 2020-12-18: 4 mg via INTRAVENOUS
  Filled 2020-12-18: qty 2

## 2020-12-18 NOTE — Progress Notes (Signed)
Patient is being transferred to [facility] for further observation by her OB/Gyn, Dr Arther Abbott.  Cleared by this OB Service.  Dr Carter Kitten updated.

## 2020-12-18 NOTE — ED Notes (Signed)
Pt states she doesn't want nausea meds at this time.  Took mylanta and Pt vomitted up mylanta partially.

## 2020-12-18 NOTE — Progress Notes (Signed)
Tracing maternal heart rate.  Requested [facility] RN to adjust monitors.

## 2020-12-18 NOTE — ED Notes (Signed)
This pregnancy is patients 3rd pregnancy, has 2 living children. Dr. Shawnie Pons is OB in [facility]. Normal pregnancy.

## 2020-12-18 NOTE — ED Provider Notes (Signed)
[facility] EMERGENCY DEPARTMENT Provider Note   CSN: [id number] Arrival date & time: 12/18/20  0900     History Chief Complaint  Patient presents with   Abdominal Pain   Chest Pain    Whitney Hatfield is a 25 y.o. female G3, P2 approximately [redacted] weeks pregnant with prenatal care from Dr. Micah Noel at [facility] who presents the emergency department with abdominal pain, chest pain, vomiting.  Patient states that symptoms began suddenly at approximately 3 AM this morning with recurrent vomiting.  Chest pain started after vomiting.  She also endorses cyclical pelvic abdominal pain that feels similar to her previous contractions.  Abdominal pain occurring every 2 minutes here in the emergency department.  No gush of fluid, no vaginal bleeding.  Denies shortness of breath, headache, cough, fever or other systemic symptoms.   Abdominal Pain Associated symptoms: chest pain, nausea and vomiting   Associated symptoms: no chills, no cough, no dysuria, no fever, no hematuria, no shortness of breath and no sore throat   Chest Pain Associated symptoms: abdominal pain, nausea and vomiting   Associated symptoms: no back pain, no cough, no fever, no palpitations and no shortness of breath       History reviewed. No pertinent past medical history.  There are no problems to display for this patient.   History reviewed. No pertinent surgical history.   OB History     Gravida  1   Para      Term      Preterm      AB      Living         SAB      IAB      Ectopic      Multiple      Live Births              History reviewed. No pertinent family history.  Social History   Tobacco Use   Smoking status: Never   Smokeless tobacco: Never  Vaping Use   Vaping Use: Never used  Substance Use Topics   Alcohol use: Not Currently    Home Medications Prior to Admission medications   Medication Sig Start Date End Date Taking? Authorizing Provider  doxylamine,  Sleep, (UNISOM) 25 MG tablet Take 1 tablet (25 mg total) by mouth at bedtime. 08/19/20   Tacy Learn, PA-C  ibuprofen (ADVIL) 600 MG tablet Take 1 tablet (600 mg total) by mouth every 6 (six) hours as needed for up to 30 doses. 05/23/19   Wyvonnia Dusky, MD  metroNIDAZOLE (FLAGYL) 500 MG tablet Take 1 tablet (500 mg total) by mouth 2 (two) times daily. 08/19/20   Tacy Learn, PA-C  ondansetron (ZOFRAN ODT) 4 MG disintegrating tablet Take 1 tablet (4 mg total) by mouth every 8 (eight) hours as needed for nausea or vomiting. 07/25/20   Joy, Shawn C, PA-C    Allergies    Patient has no known allergies.  Review of Systems   Review of Systems  Constitutional:  Negative for chills and fever.  HENT:  Negative for ear pain and sore throat.   Eyes:  Negative for pain and visual disturbance.  Respiratory:  Negative for cough and shortness of breath.   Cardiovascular:  Positive for chest pain. Negative for palpitations.  Gastrointestinal:  Positive for abdominal pain, nausea and vomiting.  Genitourinary:  Negative for dysuria and hematuria.  Musculoskeletal:  Negative for arthralgias and back pain.  Skin:  Negative for  color change and rash.  Neurological:  Negative for seizures and syncope.  All other systems reviewed and are negative.  Physical Exam Updated Vital Signs BP (!) 105/49 (BP Location: Right Arm)   Pulse 93   Temp 98.9 F (37.2 C) (Oral)   Resp 16   Ht 5\' 5"  (1.651 m)   Wt 75.3 kg   LMP 06/15/2020 (Approximate)   SpO2 100%   BMI 27.62 kg/m   Physical Exam Vitals and nursing note reviewed.  Constitutional:      General: She is not in acute distress.    Appearance: She is well-developed.  HENT:     Head: Normocephalic and atraumatic.  Eyes:     Conjunctiva/sclera: Conjunctivae normal.  Cardiovascular:     Rate and Rhythm: Normal rate and regular rhythm.     Heart sounds: No murmur heard. Pulmonary:     Effort: Pulmonary effort is normal. No respiratory distress.      Breath sounds: Normal breath sounds.  Abdominal:     Palpations: Abdomen is soft.     Tenderness: There is abdominal tenderness in the right lower quadrant and left lower quadrant.  Musculoskeletal:        General: No swelling.     Cervical back: Neck supple.  Skin:    General: Skin is warm and dry.     Capillary Refill: Capillary refill takes less than 2 seconds.  Neurological:     Mental Status: She is alert.  Psychiatric:        Mood and Affect: Mood normal.    ED Results / Procedures / Treatments   Labs (all labs ordered are listed, but only abnormal results are displayed) Labs Reviewed  COMPREHENSIVE METABOLIC PANEL  CBC WITH DIFFERENTIAL/PLATELET    EKG None  Radiology No results found.  Procedures Procedures   Medications Ordered in ED Medications  alum & mag hydroxide-simeth (MAALOX/MYLANTA) 200-200-20 MG/5ML suspension 30 mL (has no administration in time range)    ED Course  I have reviewed the triage vital signs and the nursing notes.  Pertinent labs & imaging results that were available during my care of the patient were reviewed by me and considered in my medical decision making (see chart for details).    MDM Rules/Calculators/A&P                           Patient seen the emergency department for evaluation of chest pain, abdominal pain and vomiting.  Physical exam reveals an ill-appearing patient who is tender in the right and left lower quadrants, cardiopulmonary exam unremarkable, pelvic exam with a closed os but copious white discharge in the canal.  Laboratory evaluation with a hemoglobin of 10.7, hypokalemia 3.4, hypoalbuminemia to 2.9 is otherwise unremarkable.  Troponin is unremarkable.  ECG nonischemic.  Patient is COVID-positive.  Patient given Zofran which improved her chest pain and vomiting.  I spoke with the OB/GYN team at [facility] regional due to concern that the patient may be experiencing contractions as her abdominal pain appears  to be cyclic and happening between every 2 and 5 minutes.  Patient remains uncomfortable on reevaluation but does not appear to be imminently delivering.  We do not have betamethasone here at [facility], and thus we will give 6 mg IM Decadron per pharmacy recommendations.  Patient was then transferred to L&D triage at [facility]. Final Clinical Impression(s) / ED Diagnoses Final diagnoses:  None  Rx / DC Orders ED Discharge Orders     None        Zyanya Glaza, Debe Coder, MD 12/18/20 1208

## 2020-12-18 NOTE — Progress Notes (Signed)
G1P0 at 28 1/7 weeks reports to [facility] with c/o chest pain and abdominal pain.  [facility] RN placed patient on monitors.  No bleeding or leaking reported.  Receives [facility] with Dr Arther Abbott in [facility].   Dr Carter Kitten updated on pt status.  IV fluid LR bolus ordered and will make sure chest pain workup is clear.  Continue external fetal monitoring.

## 2020-12-18 NOTE — ED Triage Notes (Addendum)
Pt is [redacted] weeks pregnant. C/o chest pain, abdominal pain and vomiting since 0300. G3P3

## 2021-06-02 ENCOUNTER — Emergency Department (HOSPITAL_COMMUNITY): Payer: Medicaid Other

## 2021-06-02 ENCOUNTER — Other Ambulatory Visit: Payer: Self-pay

## 2021-06-02 ENCOUNTER — Emergency Department (HOSPITAL_COMMUNITY)
Admission: EM | Admit: 2021-06-02 | Discharge: 2021-06-03 | Disposition: A | Discharge status: Home / Self Care | Payer: Medicaid Other | Attending: Emergency Medicine | Admitting: Emergency Medicine

## 2021-06-02 ENCOUNTER — Encounter (HOSPITAL_COMMUNITY): Payer: Self-pay

## 2021-06-02 DIAGNOSIS — R1031 Right lower quadrant pain: Secondary | ICD-10-CM | POA: Insufficient documentation

## 2021-06-02 DIAGNOSIS — R5383 Other fatigue: Secondary | ICD-10-CM | POA: Diagnosis not present

## 2021-06-02 DIAGNOSIS — N390 Urinary tract infection, site not specified: Secondary | ICD-10-CM

## 2021-06-02 DIAGNOSIS — R112 Nausea with vomiting, unspecified: Secondary | ICD-10-CM | POA: Insufficient documentation

## 2021-06-02 DIAGNOSIS — R6883 Chills (without fever): Secondary | ICD-10-CM | POA: Insufficient documentation

## 2021-06-02 DIAGNOSIS — R1033 Periumbilical pain: Secondary | ICD-10-CM | POA: Diagnosis not present

## 2021-06-02 DIAGNOSIS — K429 Umbilical hernia without obstruction or gangrene: Secondary | ICD-10-CM

## 2021-06-02 LAB — COMPREHENSIVE METABOLIC PANEL
ALT: 17 U/L (ref 0–44)
AST: 14 U/L — ABNORMAL LOW (ref 15–41)
Albumin: 4 g/dL (ref 3.5–5.0)
Alkaline Phosphatase: 47 U/L (ref 38–126)
Anion gap: 6 (ref 5–15)
BUN: 8 mg/dL (ref 6–20)
CO2: 21 mmol/L — ABNORMAL LOW (ref 22–32)
Calcium: 8.6 mg/dL — ABNORMAL LOW (ref 8.9–10.3)
Chloride: 109 mmol/L (ref 98–111)
Creatinine, Ser: 0.84 mg/dL (ref 0.44–1.00)
GFR, Estimated: >60 mL/min (ref >60)
Glucose, Bld: 96 mg/dL (ref 70–99)
Potassium: 3.5 mmol/L (ref 3.5–5.1)
Sodium: 136 mmol/L (ref 135–145)
Total Bilirubin: 1 mg/dL (ref 0.3–1.2)
Total Protein: 7.5 g/dL (ref 6.5–8.1)

## 2021-06-02 LAB — CBC WITH DIFFERENTIAL/PLATELET
Abs Immature Granulocytes: 0.04 10*3/uL (ref 0.00–0.07)
Basophils Absolute: 0 10*3/uL (ref 0.0–0.1)
Basophils Relative: 0 %
Eosinophils Absolute: 0 10*3/uL (ref 0.0–0.5)
Eosinophils Relative: 0 %
HCT: 41.6 % (ref 36.0–46.0)
Hemoglobin: 13.7 g/dL (ref 12.0–15.0)
Immature Granulocytes: 0 %
Lymphocytes Relative: 14 %
Lymphs Abs: 1.4 10*3/uL (ref 0.7–4.0)
MCH: 30.4 pg (ref 26.0–34.0)
MCHC: 32.9 g/dL (ref 30.0–36.0)
MCV: 92.2 fL (ref 80.0–100.0)
Monocytes Absolute: 0.5 10*3/uL (ref 0.1–1.0)
Monocytes Relative: 5 %
Neutro Abs: 7.5 10*3/uL (ref 1.7–7.7)
Neutrophils Relative %: 81 %
Platelets: 209 10*3/uL (ref 150–400)
RBC: 4.51 MIL/uL (ref 3.87–5.11)
RDW: 12.2 % (ref 11.5–15.5)
WBC: 9.4 10*3/uL (ref 4.0–10.5)
nRBC: 0 % (ref 0.0–0.2)

## 2021-06-02 LAB — LIPASE, BLOOD: Lipase: 24 U/L (ref 11–51)

## 2021-06-02 LAB — I-STAT BETA HCG BLOOD, ED (MC, WL, AP ONLY): I-stat hCG, quantitative: <5 m[IU]/mL (ref <5)

## 2021-06-02 LAB — LACTIC ACID, PLASMA: Lactic Acid, Venous: 0.8 mmol/L (ref 0.5–1.9)

## 2021-06-02 MED ORDER — SODIUM CHLORIDE 0.9 % IV BOLUS
1000.0000 mL | Freq: Once | INTRAVENOUS | Status: AC
  Administered 2021-06-02: 1000 mL via INTRAVENOUS

## 2021-06-02 MED ORDER — IOHEXOL 300 MG/ML  SOLN
100.0000 mL | Freq: Once | INTRAMUSCULAR | Status: AC | PRN
  Administered 2021-06-02: 100 mL via INTRAVENOUS

## 2021-06-02 MED ORDER — ONDANSETRON HCL 4 MG/2ML IJ SOLN
4.0000 mg | Freq: Once | INTRAMUSCULAR | Status: AC
  Administered 2021-06-02: 4 mg via INTRAVENOUS
  Filled 2021-06-02: qty 2

## 2021-06-02 MED ORDER — MORPHINE SULFATE (PF) 4 MG/ML IV SOLN
4.0000 mg | Freq: Once | INTRAVENOUS | Status: AC
  Administered 2021-06-02: 4 mg via INTRAVENOUS
  Filled 2021-06-02: qty 1

## 2021-06-02 NOTE — ED Triage Notes (Addendum)
Pt BIB EMS. Pt was seen at [facility] earlier. Pt was to schedule with outpatient surgery. Pt reports having vomiting and chills x 4 hrs ago. 22 g left forearm. 100 mcg Fentanyl, 4 mg Zofran given.

## 2021-06-02 NOTE — ED Provider Notes (Signed)
[facility] DEPT Provider Note   CSN: [id number] Arrival date & time: 06/02/21  2137     History  Chief Complaint  Patient presents with   Abdominal Pain    Whitney Hatfield is a 26 y.o. female.  The history is provided by the patient and medical records. No language interpreter was used.  Abdominal Pain Pain location:  RLQ and periumbilical Pain quality: aching   Pain radiates to:  Does not radiate Pain severity:  Severe Onset quality:  Gradual Duration:  1 day Timing:  Constant Progression:  Waxing and waning Chronicity:  New Relieved by:  Nothing Worsened by:  Palpation Ineffective treatments:  None tried Associated symptoms: chills, fatigue, nausea and vomiting   Associated symptoms: no anorexia, no chest pain, no constipation, no cough, no diarrhea, no dysuria, no fever, no shortness of breath, no vaginal bleeding and no vaginal discharge       Home Medications Prior to Admission medications   Medication Sig Start Date End Date Taking? Authorizing Provider  Prenatal 27-1 MG TABS Take 1 tablet by mouth daily.    [provider]      Allergies    Patient has no known allergies.    Review of Systems   Review of Systems  Constitutional:  Positive for chills and fatigue. Negative for fever.  HENT:  Negative for congestion.   Respiratory:  Negative for cough, chest tightness, shortness of breath and wheezing.   Cardiovascular:  Negative for chest pain, palpitations and leg swelling.  Gastrointestinal:  Positive for abdominal pain, nausea and vomiting. Negative for abdominal distention, anorexia, constipation and diarrhea.  Genitourinary:  Negative for dysuria, flank pain, frequency, vaginal bleeding and vaginal discharge.  Musculoskeletal:  Negative for back pain, neck pain and neck stiffness.  Skin:  Negative for rash.  Neurological:  Negative for light-headedness and headaches.  Psychiatric/Behavioral:  Negative for  agitation.   All other systems reviewed and are negative.  Physical Exam Updated Vital Signs BP 112/72 (BP Location: Right Arm)   Pulse 82   Temp 98.1 F (36.7 C) (Oral)   Resp 16   Ht 5\' 5"  (1.651 m)   Wt 78 kg   SpO2 98%   BMI 28.62 kg/m  Physical Exam Vitals and nursing note reviewed.  Constitutional:      General: She is not in acute distress.    Appearance: She is well-developed. She is not ill-appearing, toxic-appearing or diaphoretic.  HENT:     Head: Normocephalic and atraumatic.  Eyes:     Conjunctiva/sclera: Conjunctivae normal.  Cardiovascular:     Rate and Rhythm: Normal rate and regular rhythm.     Heart sounds: No murmur heard. Pulmonary:     Effort: Pulmonary effort is normal. No respiratory distress.     Breath sounds: Normal breath sounds. No wheezing, rhonchi or rales.  Chest:     Chest wall: No tenderness.  Abdominal:     General: Abdomen is flat. Bowel sounds are normal. There is no distension.     Palpations: Abdomen is soft.     Tenderness: There is abdominal tenderness in the right lower quadrant and periumbilical area. There is no right CVA tenderness, left CVA tenderness, guarding or rebound.  Musculoskeletal:        General: No swelling.     Cervical back: Neck supple.  Skin:    General: Skin is warm and dry.     Capillary Refill: Capillary refill takes less than 2 seconds.  Neurological:     General: No focal deficit present.     Mental Status: She is alert.  Psychiatric:        Mood and Affect: Mood normal.    ED Results / Procedures / Treatments   Labs (all labs ordered are listed, but only abnormal results are displayed) Labs Reviewed  CBC WITH DIFFERENTIAL/PLATELET  COMPREHENSIVE METABOLIC PANEL  LIPASE, BLOOD  URINALYSIS, ROUTINE W REFLEX MICROSCOPIC  LACTIC ACID, PLASMA  LACTIC ACID, PLASMA  I-STAT BETA HCG BLOOD, ED ([facility], [facility], [facility] ONLY)    EKG None  Radiology No results found.  From outside hospital today: EXAM: CT  ABDOMEN AND PELVIS WITH CONTRAST  TECHNIQUE: Multidetector CT imaging of the abdomen and pelvis was performed using the standard protocol following bolus administration of intravenous contrast.  RADIATION DOSE REDUCTION: This exam was performed according to the departmental dose-optimization program which includes automated exposure control, adjustment of the mA and/or kV according to patient size and/or use of iterative reconstruction technique.  CONTRAST: 80 cc of Omnipaque 350  COMPARISON: CT May 04, 2019  FINDINGS: Lower chest: No acute abnormality.  Hepatobiliary: Hypodense/hypoenhancing wedge-shaped area along the falciform ligament of segment IV, for instance on image 24/2 commonly reflects focal fatty infiltration or altered perfusion (veins of [place]). Gallbladder is unremarkable. No biliary ductal dilation.  Pancreas: No pancreatic ductal dilation or evidence of acute inflammation.  Spleen: No splenomegaly or focal splenic lesion.  Adrenals/Urinary Tract: Bilateral adrenal glands appear normal. No hydronephrosis. Kidneys demonstrate symmetric enhancement. Mild symmetric wall thickening of an incompletely distended urinary bladder.  Stomach/Bowel: No radiopaque enteric contrast material was administered stomach is unremarkable for degree of distension. No pathologic dilation of small or large. There is some hyperenhancement of the wall the appendix which measures upper limits of normal at 6 mm but does demonstrate gas in the tip of the appendix.  Vascular/Lymphatic: Normal caliber abdominal aorta. No pathologically enlarged abdominal or pelvic lymph nodes.  Reproductive: Normal CT appearance of the uterus and adnexa a reproductive age female.  Other: Trace pelvic free fluid is within physiologic normal limits. Diastasis rectus with a umbilical hernia containing fat and nonobstructed portion of small bowel.  Musculoskeletal: No acute osseous  abnormality.  IMPRESSION: 1. Diastasis rectus with a umbilical hernia containing fat and nonobstructed portion of small bowel. 2. Mild symmetric wall thickening of an incompletely distended urinary bladder. Correlate with urinalysis to exclude cystitis. 3. Slight hyperenhancement of the wall the appendix which measures upper limits of normal but does demonstrate gas in the tip of the appendix, likely within normal limits however early appendicitis could appear similar.    Procedures Procedures    Medications Ordered in ED Medications  ondansetron (ZOFRAN) injection 4 mg (4 mg Intravenous Given 06/02/21 2232)  morphine (PF) 4 MG/ML injection 4 mg (4 mg Intravenous Given 06/02/21 2233)  sodium chloride 0.9 % bolus 1,000 mL (1,000 mLs Intravenous New Bag/Given 06/02/21 2236)    ED Course/ Medical Decision Making/ A&P                           Medical Decision Making Amount and/or Complexity of Data Reviewed Labs: ordered. Radiology: ordered.  Risk Prescription drug management.    Whitney Hatfield is a 26 y.o. female with no significant past medical history who presents for chills, nausea, vomiting, and abdominal pain.  According to patient, she was seen this morning at [facility] where she had a CT  scan showing evidence of intermittent periumbilical hernia with diastases rectus and some enhancement of the appendix.  Initially, patient reports she did not have pain in her right lower quadrant and only had pain in the umbilical area.  She reports that she had the CT scan showing the findings and was seen by general surgery.  At that time, given the reducibility and improved symptoms after medications, they felt she was appropriate for outpatient follow-up to discuss outpatient surgery.  Patient says that after leaving the hospital, her symptoms returned and worsened with nausea and vomiting and she is reporting more pain in her right lower quadrant.  She is also having some  shaking chills and feels much worse.  She reports the pain gets up to greater than 10 out of 10 in severity at times.  She was told to return if anything worsen.  On exam, lungs clear and chest nontender.  Abdomen is tender both periumbilical and in the right lower quadrant.  I was unable to palpate a protruding hernia near the umbilicus.  bowel sounds are appreciated.  She reports she is still passing gas but has not had a bowel movement today.  She denies any pelvic or vaginal symptoms.  She denies any flank or back pain.  Denies any trauma.  She reports she had some nausea vomiting.  Exam otherwise unremarkable.  I was able to see the CT imaging both the images and report which I reviewed myself.  I was able to paste the read report above.  Given the worsening symptoms and the pain being in the right lower quadrant now, I am somewhat concerned about development of appendicitis.  We will give her pain medicine, nausea medicine, fluids, get repeat labs, and will call general surgery for recommendations.  Anticipate admission for either worsened hernia pain versus appendicitis.  10:48 PM Just spoke to Dr. Ninfa Linden with general surgery who requests we do a repeat CT scan to look for worsened evidence of appendicitis versus other abnormality such as hernia.  Patient agrees to this plan.  Anticipate disposition after CT has been completed.     Care transferred to oncoming team to await results of CT scan.  Anticipate discussion with general surgery after CT has been completed.         Final Clinical Impression(s) / ED Diagnoses Final diagnoses:  Right lower quadrant abdominal pain     Clinical Impression: 1. Right lower quadrant abdominal pain     Disposition: Admit  This note was prepared with assistance of Dragon voice recognition software. Occasional wrong-word or sound-a-like substitutions may have occurred due to the inherent limitations of voice recognition software.       Boston Catarino, Gwenyth Allegra, MD 06/02/21 [phone]

## 2021-06-03 LAB — URINALYSIS, ROUTINE W REFLEX MICROSCOPIC
Bilirubin Urine: NEGATIVE
Glucose, UA: NEGATIVE mg/dL
Hgb urine dipstick: NEGATIVE
Ketones, ur: 5 mg/dL — AB
Nitrite: NEGATIVE
Protein, ur: 30 mg/dL — AB
Specific Gravity, Urine: >1.046 — ABNORMAL HIGH (ref 1.005–1.030)
WBC, UA: >50 WBC/hpf — ABNORMAL HIGH (ref 0–5)
pH: 8 (ref 5.0–8.0)

## 2021-06-03 LAB — WET PREP, GENITAL
Clue Cells Wet Prep HPF POC: NONE SEEN
Sperm: NONE SEEN
Trich, Wet Prep: NONE SEEN
WBC, Wet Prep HPF POC: >=10 — AB (ref <10)
Yeast Wet Prep HPF POC: NONE SEEN

## 2021-06-03 LAB — GC/CHLAMYDIA PROBE AMP (~~LOC~~) NOT AT ARMC
Chlamydia: NEGATIVE
Comment: NEGATIVE
Comment: NORMAL
Neisseria Gonorrhea: POSITIVE — AB

## 2021-06-03 MED ORDER — CEFDINIR 300 MG PO CAPS: 300.0000 mg | ORAL_CAPSULE | Freq: Two times a day (BID) | ORAL | 0 refills | Status: AC

## 2021-06-03 MED ORDER — HYDROCORTISONE 1 % EX OINT: 1.0000 "application " | TOPICAL_OINTMENT | Freq: Two times a day (BID) | CUTANEOUS | 0 refills | Status: AC

## 2021-06-03 MED ORDER — ONDANSETRON HCL 4 MG PO TABS: 4.0000 mg | ORAL_TABLET | Freq: Four times a day (QID) | ORAL | 0 refills | Status: AC | PRN

## 2021-06-03 MED ORDER — SODIUM CHLORIDE 0.9 % IV SOLN
1.0000 g | Freq: Once | INTRAVENOUS | Status: AC
  Administered 2021-06-03: 1 g via INTRAVENOUS
  Filled 2021-06-03: qty 10

## 2021-06-03 MED ORDER — SODIUM CHLORIDE 0.9 % IV BOLUS
1000.0000 mL | Freq: Once | INTRAVENOUS | Status: AC
  Administered 2021-06-03: 1000 mL via INTRAVENOUS

## 2021-06-03 NOTE — ED Provider Notes (Signed)
Patient care assumed at 2330.  Patient was evaluated earlier today at [facility] for abdominal pain, had a CT scan performed that demonstrated an umbilical hernia as well as possible appendicitis.  She was discharged home with plan to follow-up as an outpatient.  She represented to the [facility] emergency department for worsening abdominal pain with 2 episodes of emesis.  Care assumed pending CT abdomen pelvis.  CT abdomen pelvis with no acute findings.  On evaluation at the bedside patient reports that her pain is gone.  On abdominal examination she has mild to moderate central and right-sided abdominal tenderness without peritoneal findings.  No palpable hernia on this evaluation.  Patient does complain of some mild suprapubic discomfort.  She also reports some vaginal discharge, does have a new sexual partner.  Pelvic examination with no significant vaginal discharge, no CMT or adnexal tenderness.  UA is concerning for UTI-we will start antibiotics given her symptoms.  Patient observed in the emergency department, continues to state her pain is resolved does have mild central abdominal tenderness on examination.  Discussed the patient with Dr. Magnus Ivan with general surgery.  He recommends discharge with outpatient surgery follow-up.  No current evidence of incarceration.  Patient is in agreement with treatment plan.  Please note patient did have transient hypotension during her ED stay-no evidence of sepsis.  Many of those blood pressures were taken while she was sleeping on her side, suspect these are not all accurate pressures.   Tilden Fossa, MD 06/03/21 [phone]

## 2021-06-03 NOTE — ED Notes (Signed)
Patient provided Sprite per request.

## 2021-06-05 LAB — URINE CULTURE: Culture: >=100000 — AB

## 2021-06-06 ENCOUNTER — Telehealth (HOSPITAL_BASED_OUTPATIENT_CLINIC_OR_DEPARTMENT_OTHER): Payer: Self-pay | Admitting: *Deleted

## 2021-06-06 NOTE — Progress Notes (Addendum)
ED Antimicrobial Stewardship Positive Culture Follow Up   Whitney Hatfield is an 26 y.o. female who presented to [facility] on 06/02/2021 with a chief complaint of  abdominal pain, n/v, chills and vaginal discharge.  Chief Complaint  Patient presents with   Abdominal Pain    Recent Results (from the past 720 hour(s))  Urine Culture     Status: Abnormal   Collection Time: 06/03/21  1:31 AM   Specimen: Urine, Clean Catch  Result Value Ref Range Status   Specimen Description   Final    URINE, CLEAN CATCH Performed at [facility], [address], [place], [place] [address]    Special Requests   Final    NONE Performed at [facility], [address], [place], [place] [address]    Culture >=100,000 COLONIES/mL STAPHYLOCOCCUS SAPROPHYTICUS (A)  Final   Report Status 06/05/2021 FINAL  Final   Organism ID, Bacteria STAPHYLOCOCCUS SAPROPHYTICUS (A)  Final      Susceptibility   Staphylococcus saprophyticus - MIC*    CIPROFLOXACIN <=0.5 SENSITIVE Sensitive     GENTAMICIN <=0.5 SENSITIVE Sensitive     NITROFURANTOIN <=16 SENSITIVE Sensitive     OXACILLIN >=4 RESISTANT Resistant     TETRACYCLINE <=1 SENSITIVE Sensitive     VANCOMYCIN <=0.5 SENSITIVE Sensitive     TRIMETH/SULFA <=10 SENSITIVE Sensitive     CLINDAMYCIN <=0.25 SENSITIVE Sensitive     RIFAMPIN <=0.5 SENSITIVE Sensitive     Inducible Clindamycin NEGATIVE Sensitive     * >=100,000 COLONIES/mL STAPHYLOCOCCUS SAPROPHYTICUS  Wet prep, genital     Status: Abnormal   Collection Time: 06/03/21  5:39 AM   Specimen: PATH Cytology Cervicovaginal Ancillary Only  Result Value Ref Range Status   Yeast Wet Prep HPF POC NONE SEEN NONE SEEN Final   Trich, Wet Prep NONE SEEN NONE SEEN Final   Clue Cells Wet Prep HPF POC NONE SEEN NONE SEEN Final   WBC, Wet Prep HPF POC >=10 (A) <10 Final   Sperm NONE SEEN  Final    Comment: Performed at [facility], [address],  [place], [place] [address]   Plan:  1) Patient needs to go to Urgent Care or come back to the ED to get ceftriaxone 500 mg IM x1 for Gonorrhea  ED Provider: Norman Clay, MD  Lucia Gaskins 06/06/2021, 9:12 AM Clinical Pharmacist [phone]   ___________________________________ Adden:  Spoke to patient on the phone re: Gonorrhea result.  She reported that someone from [facility] called her a couple of days ago to inform her of the positive Gonorrhea result and prescribed her abx for it.  Dorna Leitz, PharmD, BCPS 06/06/2021 12:52 PM

## 2021-06-06 NOTE — Telephone Encounter (Signed)
Post ED Visit - Positive Culture Follow-up  Culture report reviewed by antimicrobial stewardship pharmacist: [facility] []  , Pharm.D. []  Enzo Bi, Pharm.D., BCPS AQ-ID []  , Pharm.D., BCPS []  Celedonio Miyamoto, Pharm.D., BCPS []  [place], Garvin Fila.D., BCPS, AAHIVP []  , Pharm.D., BCPS, AAHIVP []  Georgina Pillion, PharmD, BCPS []  , PharmD, BCPS []  [place], PharmD, BCPS []  [place], PharmD []  , PharmD, BCPS []  Estella Husk, PharmD  Pharmacy Team []  Lysle Pearl, PharmD []  , PharmD []  Phillips Climes, PharmD []  , Rph []  Agapito Games) , PharmD []  Verlan Friends, PharmD []  , PharmD [x]  Mervyn Gay, PharmD []  , PharmD []  Vinnie Level, PharmD []  Wonda [facility], PharmD []  , PharmD []  Len Childs, PharmD   Positive urine culture Treated with Cefdinir, and no further patient follow-up is required at this time.  06/06/2021, 2:24 PM

## 2021-07-11 ENCOUNTER — Emergency Department (HOSPITAL_BASED_OUTPATIENT_CLINIC_OR_DEPARTMENT_OTHER)
Admission: EM | Admit: 2021-07-11 | Discharge: 2021-07-11 | Disposition: A | Discharge status: Home / Self Care | Payer: Medicaid Other | Attending: Emergency Medicine | Admitting: Emergency Medicine

## 2021-07-11 ENCOUNTER — Other Ambulatory Visit: Payer: Self-pay

## 2021-07-11 ENCOUNTER — Encounter (HOSPITAL_BASED_OUTPATIENT_CLINIC_OR_DEPARTMENT_OTHER): Payer: Self-pay

## 2021-07-11 DIAGNOSIS — A6004 Herpesviral vulvovaginitis: Secondary | ICD-10-CM | POA: Insufficient documentation

## 2021-07-11 DIAGNOSIS — Z8619 Personal history of other infectious and parasitic diseases: Secondary | ICD-10-CM | POA: Insufficient documentation

## 2021-07-11 DIAGNOSIS — N898 Other specified noninflammatory disorders of vagina: Secondary | ICD-10-CM

## 2021-07-11 LAB — WET PREP, GENITAL
Clue Cells Wet Prep HPF POC: NONE SEEN
Sperm: NONE SEEN
Trich, Wet Prep: NONE SEEN
WBC, Wet Prep HPF POC: >=10 — AB (ref <10)
Yeast Wet Prep HPF POC: NONE SEEN

## 2021-07-11 MED ORDER — CEFTRIAXONE SODIUM 500 MG IJ SOLR
500.0000 mg | Freq: Once | INTRAMUSCULAR | Status: AC
  Administered 2021-07-11: 500 mg via INTRAMUSCULAR
  Filled 2021-07-11: qty 500

## 2021-07-11 MED ORDER — VALACYCLOVIR HCL 1 G PO TABS: 1000.0000 mg | ORAL_TABLET | Freq: Two times a day (BID) | ORAL | 0 refills | Status: AC

## 2021-07-11 MED ORDER — AZITHROMYCIN 250 MG PO TABS
1000.0000 mg | ORAL_TABLET | Freq: Once | ORAL | Status: AC
  Administered 2021-07-11: 1000 mg via ORAL
  Filled 2021-07-11: qty 4

## 2021-07-11 MED ORDER — LIDOCAINE HCL (PF) 1 % IJ SOLN
1.0000 mL | Freq: Once | INTRAMUSCULAR | Status: AC
  Administered 2021-07-11: 2.1 mL
  Filled 2021-07-11: qty 5

## 2021-07-11 NOTE — ED Notes (Signed)
Pelvic cart at bedside. 

## 2021-07-11 NOTE — Discharge Instructions (Addendum)
It was our pleasure to provide your ER care today - we hope that you feel better.  Upon reviewing your records, your gonorrhea test done in May was positive - you were treated with antibiotics in the ER today.  Have any sexual partner(s) checked by their doctor or county health dept.   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   Avoid sex until after symptoms have completely resolved, then use condoms to help decrease risk of stds.  Return to ER if worse, new symptoms, fevers, severe abdominal or pelvic pain, or other concern.

## 2021-07-11 NOTE — ED Provider Notes (Signed)
[facility] EMERGENCY DEPARTMENT Provider Note   CSN: [id number] Arrival date & time: 07/11/21  8563     History  Chief Complaint  Patient presents with   Vaginal Discharge    Whitney Hatfield is a 26 y.o. female.  Patient c/o greenish vaginal discharge as well as painful bumps on outside of vagina. Symptoms acute onset in past two days. States recent intercourse in past few days, but no known std exposure. Denies abd or pelvic pain. Indicates takes bcp, so not sure when last period was.  No fever/chills/sweats. No rash or joint pain.   The history is provided by the patient and medical records.  Vaginal Discharge Associated symptoms: no abdominal pain, no dysuria, no fever and no vomiting        Home Medications Prior to Admission medications   Medication Sig Start Date End Date Taking? Authorizing Provider  cefdinir (OMNICEF) 300 MG capsule Take 1 capsule (300 mg total) by mouth 2 (two) times daily. 06/03/21   Tilden Fossa, MD  hydrocortisone 1 % ointment Apply 1 application. topically 2 (two) times daily. 06/03/21   Tilden Fossa, MD  ondansetron (ZOFRAN) 4 MG tablet Take 1 tablet (4 mg total) by mouth every 6 (six) hours as needed for nausea or vomiting. 06/03/21   Tilden Fossa, MD  Prenatal 27-1 MG TABS Take 1 tablet by mouth daily.    [provider]      Allergies    Patient has no known allergies.    Review of Systems   Review of Systems  Constitutional:  Negative for chills and fever.  HENT:  Negative for sore throat.   Gastrointestinal:  Negative for abdominal pain and vomiting.  Genitourinary:  Positive for vaginal discharge. Negative for dysuria, pelvic pain and vaginal bleeding.  Musculoskeletal:  Negative for arthralgias.  Skin:  Negative for rash.  Neurological:  Negative for headaches.    Physical Exam Updated Vital Signs BP 125/67   Pulse 79   Temp 98.1 F (36.7 C) (Oral)   Resp 16   SpO2 100%  Physical Exam Vitals and  nursing note reviewed.  Constitutional:      Appearance: Normal appearance. She is well-developed.  HENT:     Head: Atraumatic.     Nose: Nose normal.     Mouth/Throat:     Mouth: Mucous membranes are moist.  Eyes:     General: No scleral icterus.    Conjunctiva/sclera: Conjunctivae normal.  Neck:     Trachea: No tracheal deviation.  Cardiovascular:     Rate and Rhythm: Normal rate.     Pulses: Normal pulses.  Pulmonary:     Effort: Pulmonary effort is normal. No respiratory distress.  Abdominal:     General: Bowel sounds are normal. There is no distension.     Palpations: Abdomen is soft.     Tenderness: There is no abdominal tenderness. There is no guarding.  Genitourinary:    Comments: No cva tenderness. Mild yellowish/gr vaginal d/c. No cmt. No adx masses/tenderness. Painful/tender vesicular lesions/erythematous base labia minora/introitus area in ~ 6-7 o'clock position.  Musculoskeletal:        General: No swelling.     Cervical back: Neck supple. No muscular tenderness.  Skin:    General: Skin is warm and dry.     Findings: No rash.  Neurological:     Mental Status: She is alert.     Comments: Alert, speech normal.   Psychiatric:  Mood and Affect: Mood normal.     ED Results / Procedures / Treatments   Labs (all labs ordered are listed, but only abnormal results are displayed) Results for orders placed or performed during the hospital encounter of 07/11/21  Wet prep, genital  Result Value Ref Range   Yeast Wet Prep HPF POC NONE SEEN NONE SEEN   Trich, Wet Prep NONE SEEN NONE SEEN   Clue Cells Wet Prep HPF POC NONE SEEN NONE SEEN   WBC, Wet Prep HPF POC >=10 (A) <10   Sperm NONE SEEN      EKG None  Radiology No results found.  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 Labs: ordered. Decision-making details documented in ED Course.  Risk Prescription drug management.   Reviewed nursing notes and prior charts for additional history.  Reviewed prior charts - +GC test end of May - discussed w pt whether had returned post visit for IM abx, pt indicates 'no'.   Labs reviewed/interpreted by me - wet prep neg for clue cells.   Confirmed nkda w pt.   Rocephin IM, zithromax PO.  External lesions appear c/w HSV. Will give rx valtrex.          Final Clinical Impression(s) / ED Diagnoses Final diagnoses:  None    Rx / DC Orders ED Discharge Orders     None         Cathren Laine, MD 07/11/21 [phone]

## 2021-07-11 NOTE — ED Triage Notes (Signed)
Patient states she had intercourse 2 days ago and had bumps on outside of vagina after feeling, did not see what they looked like. Also endorses green discharge.

## 2021-07-11 NOTE — ED Notes (Signed)
Patient is unable to give a urine specimen at this time  

## 2021-07-13 LAB — GC/CHLAMYDIA PROBE AMP (~~LOC~~) NOT AT ARMC
Chlamydia: NEGATIVE
Comment: NEGATIVE
Comment: NORMAL
Neisseria Gonorrhea: NEGATIVE

## 2021-07-13 LAB — HSV CULTURE AND TYPING

## 2021-07-15 ENCOUNTER — Other Ambulatory Visit (HOSPITAL_BASED_OUTPATIENT_CLINIC_OR_DEPARTMENT_OTHER): Payer: Self-pay

## 2021-07-17 LAB — HSV DNA BY PCR (REFERENCE LAB)
HSV 1 DNA: NEGATIVE
HSV 2 DNA: POSITIVE — AB

## 2021-07-24 ENCOUNTER — Other Ambulatory Visit (HOSPITAL_BASED_OUTPATIENT_CLINIC_OR_DEPARTMENT_OTHER): Payer: Self-pay

## 2023-06-04 IMAGING — CT CT ABD-PELV W/ CM
2 of 4 series · 16 of 46 positions shown, 18 images · IV contrast (agent unspecified)
Comparison: [DATE] [DATE], [DATE] ([DATE] a.m.)

CLINICAL DATA: Nausea and vomiting with chills x4 hours.

EXAM:
CT ABDOMEN AND PELVIS WITH CONTRAST
TECHNIQUE: Multidetector CT imaging of the abdomen and pelvis was performed
using the standard protocol following bolus administration of
intravenous contrast.

[Series 2: axial st · axial · 0.73mm/px · z∈[-467,-67]mm · 13 of 92 slices shown, 15 images]
[im 6/92  soft-tissue]
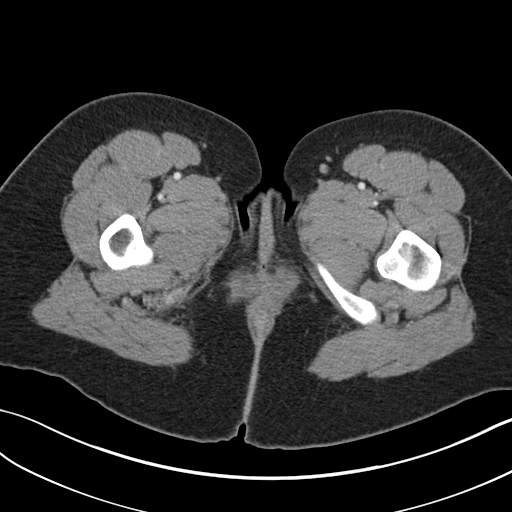
[im 6/92  bone]
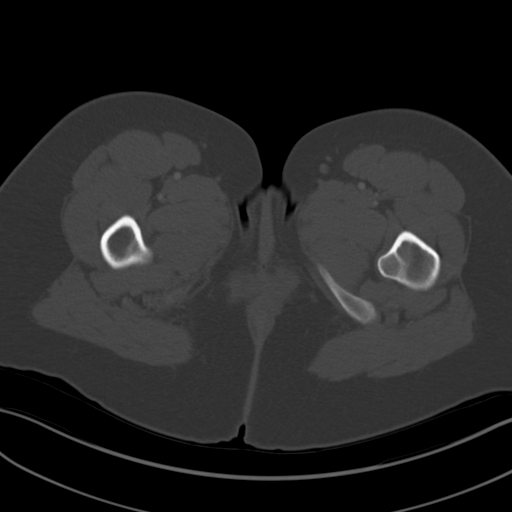
[im 12/92  soft-tissue]
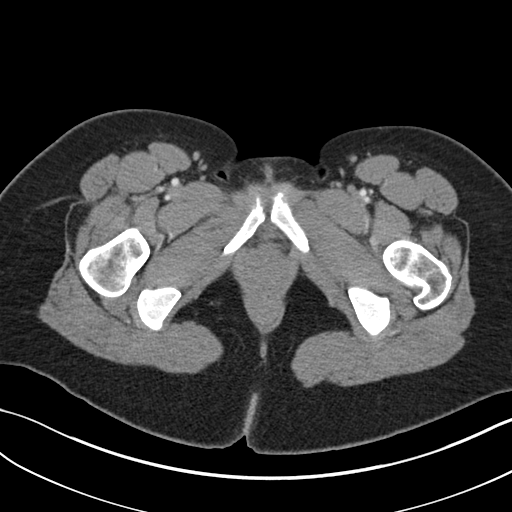
[im 18/92  soft-tissue]
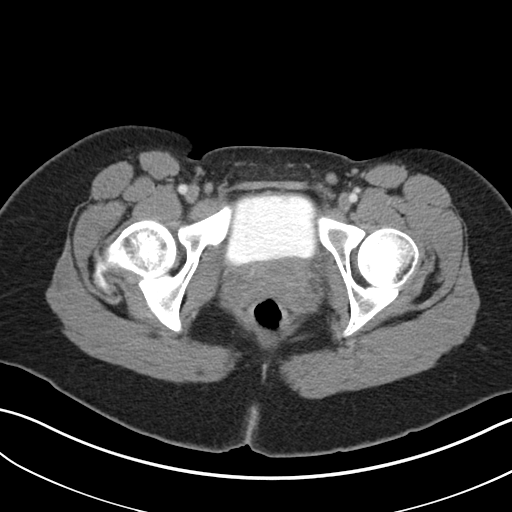
[im 29/92  soft-tissue]
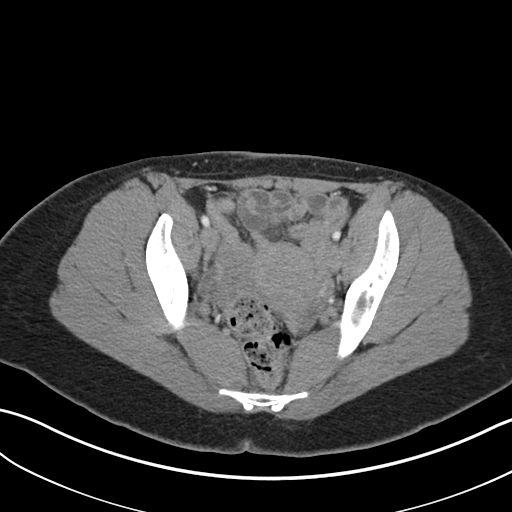
[im 35/92  soft-tissue]
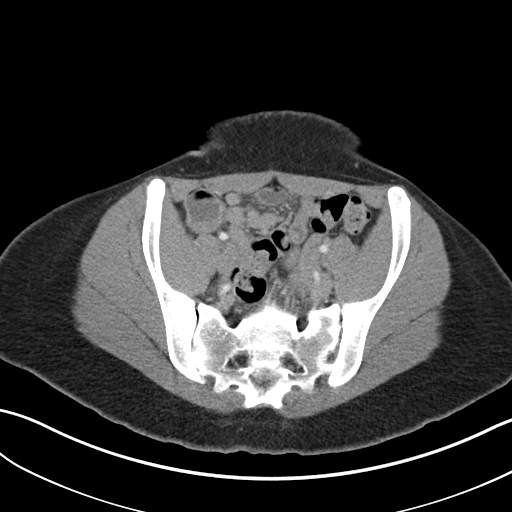
[im 40/92  soft-tissue]
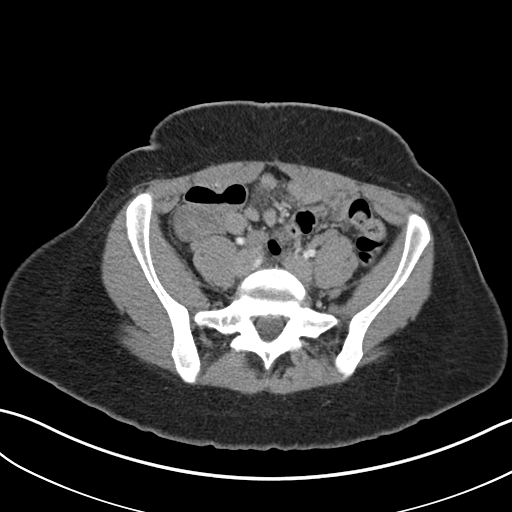
[im 46/92  soft-tissue]
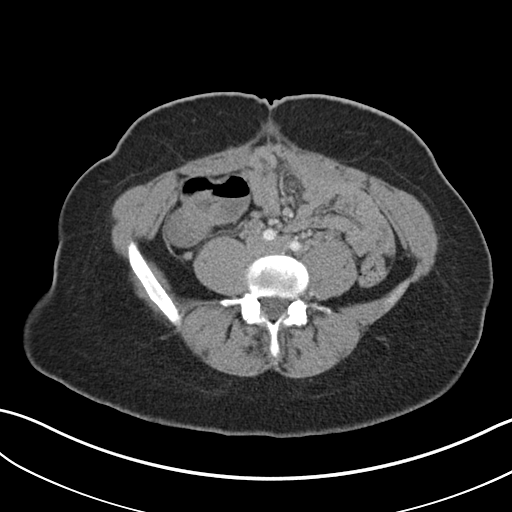
[im 52/92  soft-tissue]
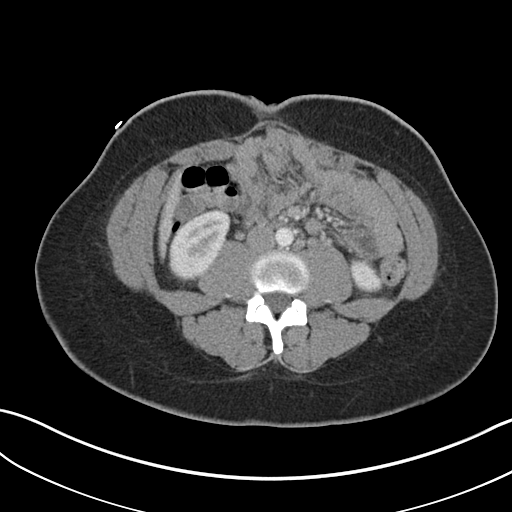
[im 57/92  soft-tissue]
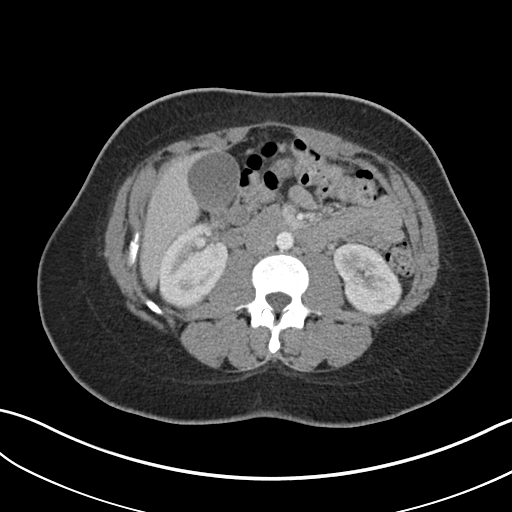
[im 57/92  bone]
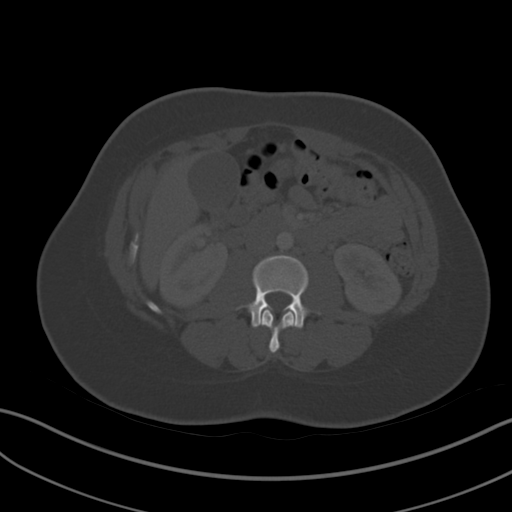
[im 63/92  soft-tissue]
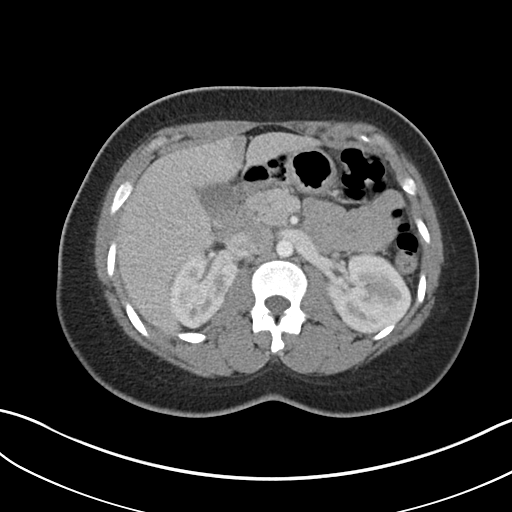
[im 74/92  soft-tissue]
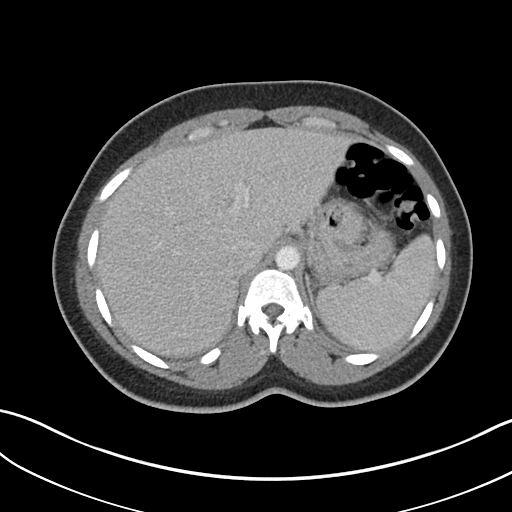
[im 80/92  soft-tissue]
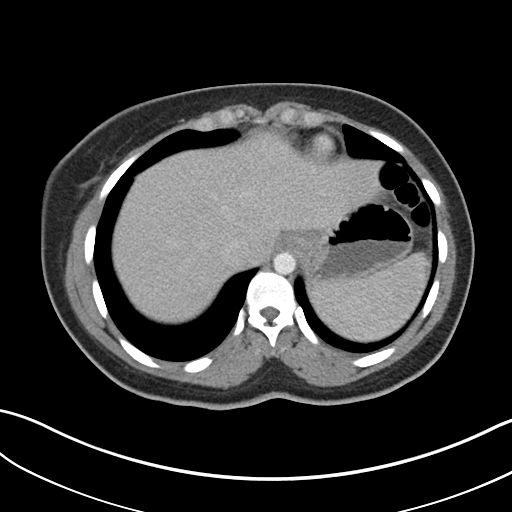
[im 86/92  soft-tissue]
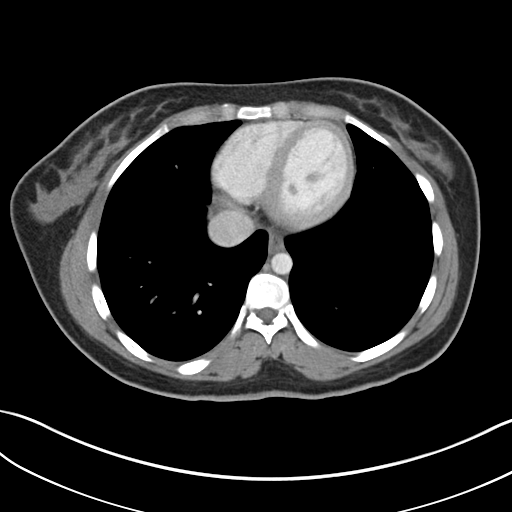

[Series 4: coronal st · coronal · 0.87mm/px · 3 of 125 slices shown]
[im 42/125  soft-tissue]
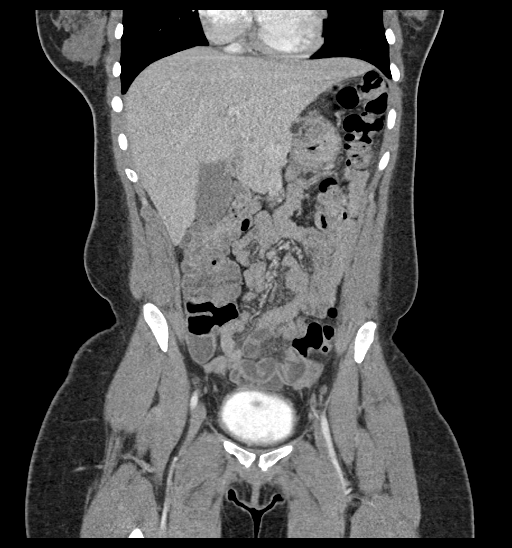
[im 56/125  soft-tissue]
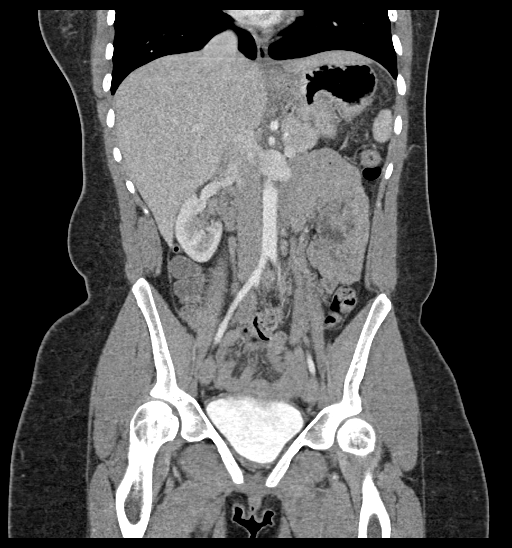
[im 69/125  soft-tissue]
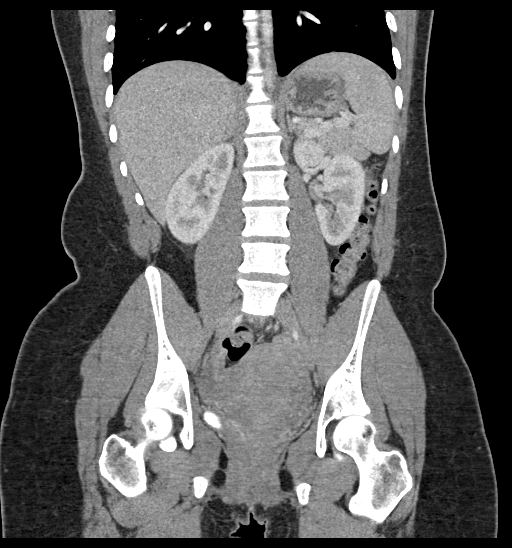

[16 of 46 positions shown; findings below may reference images not displayed]

RADIATION DOSE REDUCTION: This exam was performed according to the
departmental dose-optimization program which includes automated
exposure control, adjustment of the mA and/or kV according to
patient size and/or use of iterative reconstruction technique.

CONTRAST:  100mL OMNIPAQUE IOHEXOL 300 MG/ML  SOLN
FINDINGS: Lower chest: No acute abnormality.

Hepatobiliary: No focal liver abnormality is seen. No gallstones,
gallbladder wall thickening, or biliary dilatation.

Pancreas: Unremarkable. No pancreatic ductal dilatation or
surrounding inflammatory changes.

Spleen: Normal in size without focal abnormality.

Adrenals/Urinary Tract: Adrenal glands are unremarkable. Kidneys are
normal, without renal calculi, focal lesion, or hydronephrosis.
Contrast is seen within the lumen of an otherwise normal appearing
urinary bladder.

Stomach/Bowel: Stomach is within normal limits. Appendix appears
normal. No evidence of bowel wall thickening, distention, or
inflammatory changes.

Vascular/Lymphatic: No significant vascular findings are present.
There is mild para-aortic lymphadenopathy (the largest para-aortic
lymph node measures approximately 1.0 cm).

Reproductive: Uterus and bilateral adnexa are unremarkable.

Other: Stable diastasis rectus with small, fat containing umbilical
hernia is noted. No abdominopelvic ascites.

Musculoskeletal: No acute or significant osseous findings.
IMPRESSION: 1. Stable diastasis rectus with small, fat containing umbilical
hernia.
2. Normal appendix.
